# Patient Record
Sex: Female | Born: 1990
Health system: Southern US, Community
[De-identification: ages and names within clinical notes are randomized; demographics above are authoritative.]

## PROBLEM LIST (undated history)

## (undated) DIAGNOSIS — F319 Bipolar disorder, unspecified: Secondary | ICD-10-CM

## (undated) DIAGNOSIS — F329 Major depressive disorder, single episode, unspecified: Secondary | ICD-10-CM

## (undated) DIAGNOSIS — J45909 Unspecified asthma, uncomplicated: Secondary | ICD-10-CM

## (undated) DIAGNOSIS — F419 Anxiety disorder, unspecified: Secondary | ICD-10-CM

## (undated) DIAGNOSIS — F32A Depression, unspecified: Secondary | ICD-10-CM

## (undated) HISTORY — DX: Unspecified asthma, uncomplicated: J45.909

## (undated) HISTORY — DX: Bipolar disorder, unspecified: F31.9

## (undated) HISTORY — DX: Anxiety disorder, unspecified: F41.9

## (undated) HISTORY — DX: Depression, unspecified: F32.A

---
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
## 2008-01-21 DIAGNOSIS — J45909 Unspecified asthma, uncomplicated: Secondary | ICD-10-CM | POA: Insufficient documentation

## 2008-05-27 DIAGNOSIS — L708 Other acne: Secondary | ICD-10-CM | POA: Insufficient documentation

## 2010-04-16 DIAGNOSIS — J309 Allergic rhinitis, unspecified: Secondary | ICD-10-CM | POA: Insufficient documentation

## 2010-04-16 DIAGNOSIS — F329 Major depressive disorder, single episode, unspecified: Secondary | ICD-10-CM | POA: Insufficient documentation

## 2010-04-16 DIAGNOSIS — H1045 Other chronic allergic conjunctivitis: Secondary | ICD-10-CM | POA: Insufficient documentation

## 2011-06-16 DIAGNOSIS — J039 Acute tonsillitis, unspecified: Secondary | ICD-10-CM | POA: Insufficient documentation

## 2016-09-15 ENCOUNTER — Other Ambulatory Visit: Payer: Self-pay | Admitting: Nurse Practitioner

## 2016-09-15 ENCOUNTER — Other Ambulatory Visit (HOSPITAL_COMMUNITY)
Admission: RE | Admit: 2016-09-15 | Discharge: 2016-09-15 | Disposition: A | Discharge status: Home / Self Care | Payer: BLUE CROSS/BLUE SHIELD | Source: Ambulatory Visit | Attending: Nurse Practitioner | Admitting: Nurse Practitioner

## 2016-09-15 DIAGNOSIS — Z113 Encounter for screening for infections with a predominantly sexual mode of transmission: Secondary | ICD-10-CM | POA: Insufficient documentation

## 2016-09-15 DIAGNOSIS — Z124 Encounter for screening for malignant neoplasm of cervix: Secondary | ICD-10-CM | POA: Insufficient documentation

## 2016-09-22 LAB — CYTOLOGY - PAP
Chlamydia: NEGATIVE
DIAGNOSIS: UNDETERMINED — AB
HPV (WINDOPATH): NOT DETECTED
Neisseria Gonorrhea: NEGATIVE

## 2018-07-12 ENCOUNTER — Encounter: Payer: Self-pay | Admitting: Family Medicine

## 2018-07-12 ENCOUNTER — Ambulatory Visit: Payer: 59 | Admitting: Family Medicine

## 2018-07-12 VITALS — BP 124/78 | HR 99 | Temp 98.6°F | Ht 63.0 in | Wt 215.6 lb

## 2018-07-12 DIAGNOSIS — M542 Cervicalgia: Secondary | ICD-10-CM | POA: Diagnosis not present

## 2018-07-12 DIAGNOSIS — M62838 Other muscle spasm: Secondary | ICD-10-CM

## 2018-07-12 MED ORDER — NABUMETONE 500 MG PO TABS: 500.0000 mg | ORAL_TABLET | Freq: Two times a day (BID) | ORAL | 0 refills | Status: DC

## 2018-07-12 MED ORDER — CYCLOBENZAPRINE HCL 5 MG PO TABS: 5.0000 mg | ORAL_TABLET | Freq: Every evening | ORAL | 0 refills | Status: DC | PRN

## 2018-07-12 NOTE — Patient Instructions (Signed)
Use heating pad 2x/day for 10-15 min  Stretching exercises after heating pad at least once per day Take relafen 500mg  1 tab twice per day with food x 7 days Take flexeril 5mg  1 tab at bedtime as needed - this will make you sleepy   Neck Exercises Neck exercises can be important for many reasons:  They can help you to improve and maintain flexibility in your neck. This can be especially important as you age.  They can help to make your neck stronger. This can make movement easier.  They can reduce or prevent neck pain.  They may help your upper back. Ask your health care provider which neck exercises would be best for you. Exercises to improve neck flexibility Neck stretch Repeat this exercise 3-5 times. 1. Do this exercise while standing or while sitting in a chair. 2. Place your feet flat on the floor, shoulder-width apart. 3. Slowly turn your head to the right. Turn it all the way to the right so you can look over your right shoulder. Do not tilt or tip your head. 4. Hold this position for 10-30 seconds. 5. Slowly turn your head to the left, to look over your left shoulder. 6. Hold this position for 10-30 seconds.  Neck retraction Repeat this exercise 8-10 times. Do this 3-4 times a day or as told by your health care provider. 1. Do this exercise while standing or while sitting in a sturdy chair. 2. Look straight ahead. Do not bend your neck. 3. Use your fingers to push your chin backward. Do not bend your neck for this movement. Continue to face straight ahead. If you are doing the exercise properly, you will feel a slight sensation in your throat and a stretch at the back of your neck. 4. Hold the stretch for 1-2 seconds. Relax and repeat. Exercises to improve neck strength Neck press Repeat this exercise 10 times. Do it first thing in the morning and right before bed or as told by your health care provider. 1. Lie on your back on a firm bed or on the floor with a pillow under  your head. 2. Use your neck muscles to push your head down on the pillow and straighten your spine. 3. Hold the position as well as you can. Keep your head facing up and your chin tucked. 4. Slowly count to 5 while holding this position. 5. Relax for a few seconds. Then repeat. Isometric strengthening Do a full set of these exercises 2 times a day or as told by your health care provider. 1. Sit in a supportive chair and place your hand on your forehead. 2. Push forward with your head and neck while pushing back with your hand. Hold for 10 seconds. 3. Relax. Then repeat the exercise 3 times. 4. Next, do thesequence again, this time putting your hand against the back of your head. Use your head and neck to push backward against the hand pressure. 5. Finally, do the same exercise on either side of your head, pushing sideways against the pressure of your hand. Prone head lifts Repeat this exercise 5 times. Do this 2 times a day or as told by your health care provider. 1. Lie face-down, resting on your elbows so that your chest and upper back are raised. 2. Start with your head facing downward, near your chest. Position your chin either on or near your chest. 3. Slowly lift your head upward. Lift until you are looking straight ahead. Then continue lifting your  head as far back as you can stretch. 4. Hold your head up for 5 seconds. Then slowly lower it to your starting position. Supine head lifts Repeat this exercise 8-10 times. Do this 2 times a day or as told by your health care provider. 1. Lie on your back, bending your knees to point to the ceiling and keeping your feet flat on the floor. 2. Lift your head slowly off the floor, raising your chin toward your chest. 3. Hold for 5 seconds. 4. Relax and repeat. Scapular retraction Repeat this exercise 5 times. Do this 2 times a day or as told by your health care provider. 1. Stand with your arms at your sides. Look straight ahead. 2. Slowly  pull both shoulders backward and downward until you feel a stretch between your shoulder blades in your upper back. 3. Hold for 10-30 seconds. 4. Relax and repeat. Contact a health care provider if:  Your neck pain or discomfort gets much worse when you do an exercise.  Your neck pain or discomfort does not improve within 2 hours after you exercise. If you have any of these problems, stop exercising right away. Do not do the exercises again unless your health care provider says that you can. Get help right away if:  You develop sudden, severe neck pain. If this happens, stop exercising right away. Do not do the exercises again unless your health care provider says that you can. This information is not intended to replace advice given to you by your health care provider. Make sure you discuss any questions you have with your health care provider. Document Released: 12/15/2014 Document Revised: 05/09/2017 Document Reviewed: 07/14/2014 Elsevier Interactive Patient Education  2019 [employer]

## 2018-07-12 NOTE — Progress Notes (Signed)
Tiffany Ellison is a 28 y.o. female  Chief Complaint  Patient presents with  . Establish Care    est care/ pinched nerve in neck/slept on it wrong/ 7 days/ went to chiropractor/ ibuprofen    HPI: Tiffany LackMary Ellison is a 28 y.o. female here to establish care with our office. She complains of neck pain x 1 week. She believes she slept wrong/funny as she woke up 7 days ago with this pain.  Pain is left-sided. She describes as "really painfully achy" and "sharp with certain movements". Pain can improve if she lays down or if she lifts her left arm up above her head. No numbness or tingling in LUE. No weakness or change in grip strength.  She has taken ibuprofen 400-600mg  every 5-6 hrs and has seen chiropractor (6 days ago) without significant relief. She states she has had similar symptoms in the past but usually resolves in a few days.   Pt denies HA, dizziness, vision changes, CP, SOB, n/v/d/c, LE edema.  Pt has a h/o anxiety, depression, bipolar 1.  Past Medical History:  Diagnosis Date  . Anxiety   . Asthma   . Bipolar 1 disorder (HCC)   . Depression     History reviewed. No pertinent surgical history.  Social History   Socioeconomic History  . Marital status: Unknown    Spouse name: Not on file  . Number of children: Not on file  . Years of education: Not on file  . Highest education level: Not on file  Occupational History  . Not on file  Social Needs  . Financial resource strain: Not on file  . Food insecurity    Worry: Not on file    Inability: Not on file  . Transportation needs    Medical: Not on file    Non-medical: Not on file  Tobacco Use  . Smoking status: Never Smoker  . Smokeless tobacco: Never Used  Substance and Sexual Activity  . Alcohol use: Yes  . Drug use: Not on file  . Sexual activity: Not on file  Lifestyle  . Physical activity    Days per week: Not on file    Minutes per session: Not on file  . Stress: Not on file  Relationships  . Social  Musicianconnections    Talks on phone: Not on file    Gets together: Not on file    Attends religious service: Not on file    Active member of club or organization: Not on file    Attends meetings of clubs or organizations: Not on file    Relationship status: Not on file  . Intimate partner violence    Fear of current or ex partner: Not on file    Emotionally abused: Not on file    Physically abused: Not on file    Forced sexual activity: Not on file  Other Topics Concern  . Not on file  Social History Narrative  . Not on file    Family History  Problem Relation Age of Onset  . Diabetes Father   . Leukemia Paternal Grandfather       There is no immunization history on file for this patient.  Outpatient Encounter Medications as of 07/12/2018  Medication Sig  . etonogestrel (NEXPLANON) 68 MG IMPL implant Inject into the skin.  . cyclobenzaprine (FLEXERIL) 5 MG tablet Take 1 tablet (5 mg total) by mouth at bedtime as needed for muscle spasms.  . nabumetone (RELAFEN) 500 MG tablet Take 1  tablet (500 mg total) by mouth 2 (two) times daily.   No facility-administered encounter medications on file as of 07/12/2018.      ROS: Pertinent positives and negatives noted in HPI. Remainder of ROS non-contributory     Allergies  Allergen Reactions  . Amoxicillin-Pot Clavulanate Diarrhea  . Azithromycin Other (See Comments)    BP 124/78   Pulse 99   Temp 98.6 F (37 C) (Oral)   Ht 5\' 3"  (1.6 m)   Wt 215 lb 9.6 oz (97.8 kg)   SpO2 97%   BMI 38.19 kg/m   Physical Exam  Constitutional: She is oriented to person, place, and time. She appears well-developed and well-nourished. No distress.  Neck: Muscular tenderness present. No spinous process tenderness present. Decreased range of motion present. No edema and no erythema present.    Neurological: She is alert and oriented to person, place, and time. She has normal reflexes. No cranial nerve deficit. Coordination normal.   Psychiatric: She has a normal mood and affect.     A/P:  1. Musculoskeletal neck pain 2. Muscle spasms of neck - heating pad BID - exercises daily - included in AVS Rx: - nabumetone (RELAFEN) 500 MG tablet; Take 1 tablet (500 mg total) by mouth 2 (two) times daily.  Dispense: 60 tablet; Refill: 0 - cyclobenzaprine (FLEXERIL) 5 MG tablet; Take 1 tablet (5 mg total) by mouth at bedtime as needed for muscle spasms.  Dispense: 30 tablet; Refill: 0 - f/u if symptoms worsen or in 2-3 wks if no/minimal improvement Discussed plan and reviewed medications with patient, including risks, benefits, and potential side effects. Pt expressed understand. All questions answered.

## 2018-07-13 ENCOUNTER — Telehealth: Payer: Self-pay

## 2018-07-13 NOTE — Telephone Encounter (Signed)
Dr.C please advise

## 2018-07-13 NOTE — Telephone Encounter (Signed)
Pt verbalized understanding.

## 2018-07-13 NOTE — Telephone Encounter (Signed)
DP-Pt called stating that the pain medication that was given yesterday is not helping at all/she would like a call back with what to do/thx dmf

## 2018-07-13 NOTE — Telephone Encounter (Signed)
Pt was seen yesterday for muscle spasm and given Rx for anti-inflammatory (relafen 500mg  BID) and muscle relaxant (flexeril 5mg  qHS). As we discussed yesterday, it will take time for these to work along with heating pad 2x/day and stretching exercises. Pt can increase flexeril to 10mg  (2 5mg  tabs) at bedtime and can take tylenol 500mg  2 tabs every 4-6 hrs PRN along with the relafen BID with food. She needs to give meds a chance to work. If no improvement but early to mid next week, she should let me know

## 2018-07-13 NOTE — Telephone Encounter (Signed)
Left pt VM to call back to inform notes below.

## 2018-07-18 ENCOUNTER — Telehealth: Payer: Self-pay

## 2018-07-18 NOTE — Telephone Encounter (Signed)
Copied from [place] [id number]. Topic: General - Other >> Jul 18, 2018  8:32 AM Keene Breath wrote: Reason for CRM: Patient called to ask the nurse to call her regarding the pain that she is still having. Patient is out of town and would like to know what she can take.  CB# [phone]

## 2018-07-19 ENCOUNTER — Encounter: Payer: Self-pay | Admitting: Nurse Practitioner

## 2018-07-19 ENCOUNTER — Ambulatory Visit (INDEPENDENT_AMBULATORY_CARE_PROVIDER_SITE_OTHER): Payer: 59 | Admitting: Nurse Practitioner

## 2018-07-19 VITALS — HR 90 | Ht 63.0 in

## 2018-07-19 DIAGNOSIS — M5412 Radiculopathy, cervical region: Secondary | ICD-10-CM

## 2018-07-19 DIAGNOSIS — M542 Cervicalgia: Secondary | ICD-10-CM

## 2018-07-19 MED ORDER — TRAMADOL HCL 50 MG PO TABS: 50.0000 mg | ORAL_TABLET | Freq: Two times a day (BID) | ORAL | 0 refills | Status: AC | PRN

## 2018-07-19 MED ORDER — PREDNISONE 10 MG (21) PO TBPK: ORAL_TABLET | ORAL | 0 refills | Status: DC

## 2018-07-19 NOTE — Telephone Encounter (Signed)
LVm for the pt to call back

## 2018-07-19 NOTE — Telephone Encounter (Signed)
Virtual appointment was scheduled for 07/19/2018 at 10:45 AM w/ Wilfred Lacy, NP.

## 2018-07-19 NOTE — Patient Instructions (Addendum)
Continue to alternated between warm and cold compress 2-3times a day. Hold relafen and tylenol. Use oral prednisone, and flexeril daily. Use tramadol for severe pain. You will be contacted to schedule appt with sport medicine next week  Cervical Radiculopathy  Cervical radiculopathy means that a nerve in the neck (a cervical nerve) is pinched or bruised. This can happen because of an injury to the cervical spine (vertebrae) in the neck, or as a normal part of getting older. This can cause pain or loss of feeling (numbness) that runs from your neck all the way down to your arm and fingers. Often, this condition gets better with rest. Treatment may be needed if the condition does not get better. What are the causes?  A neck injury.  A bulging disk in your spine.  Muscle movements that you cannot control (muscle spasms).  Tight muscles in your neck due to overuse.  Arthritis.  Breakdown in the bones and joints of the spine (spondylosis) due to getting older.  Bone spurs that form near the nerves in the neck. What are the signs or symptoms?  Pain. The pain may: ? Run from the neck to the arm and hand. ? Be very bad or irritating. ? Be worse when you move your neck.  Loss of feeling or tingling in your arm or hand.  Weakness in your arm or hand, in very bad cases. How is this treated? In many cases, treatment is not needed for this condition. With rest, the condition often gets better over time. If treatment is needed, options may include:  Wearing a soft neck collar (cervical collar) for short periods of time, as told by your doctor.  Doing exercises (physical therapy) to strengthen your neck muscles.  Taking medicines.  Having shots (injections) in your spine, in very bad cases.  Having surgery. This may be needed if other treatments do not help. The type of surgery that is used depends on the cause of your condition. Follow these instructions at home: If you have a soft  neck collar:  Wear it as told by your doctor. Remove it only as told by your doctor.  Ask your doctor if you can remove the collar for cleaning and bathing. If you are allowed to remove the collar for cleaning or bathing: ? Follow instructions from your doctor about how to remove the collar safely. ? Clean the collar by wiping it with mild soap and water and drying it completely. ? Take out any removable pads in the collar every 1-2 days. Wash them by hand with soap and water. Let them air-dry completely before you put them back in the collar. ? Check your skin under the collar for redness or sores. If you see any, tell your doctor. Managing pain      Take over-the-counter and prescription medicines only as told by your doctor.  If told, put ice on the painful area. ? If you have a soft neck collar, remove it as told by your doctor. ? Put ice in a plastic bag. ? Place a towel between your skin and the bag. ? Leave the ice on for 20 minutes, 2-3 times a day.  If using ice does not help, you can try using heat. Use the heat source that your doctor recommends, such as a moist heat pack or a heating pad. ? Place a towel between your skin and the heat source. ? Leave the heat on for 20-30 minutes. ? Remove the heat if your skin  turns bright red. This is very important if you are unable to feel pain, heat, or cold. You may have a greater risk of getting burned.  You may try a gentle neck and shoulder rub (massage). Activity  Rest as needed.  Return to your normal activities as told by your doctor. Ask your doctor what activities are safe for you.  Do exercises as told by your doctor or physical therapist.  Do not lift anything that is heavier than 10 lb (4.5 kg) until your doctor tells you that it is safe. General instructions  Use a flat pillow when you sleep.  Do not drive while wearing a soft neck collar. If you do not have a soft neck collar, ask your doctor if it is safe to  drive while your neck heals.  Ask your doctor if the medicine prescribed to you requires you to avoid driving or using heavy machinery.  Do not use any products that contain nicotine or tobacco, such as cigarettes, e-cigarettes, and chewing tobacco. These can delay healing. If you need help quitting, ask your doctor.  Keep all follow-up visits as told by your doctor. This is important. Contact a doctor if:  Your condition does not get better with treatment. Get help right away if:  Your pain gets worse and is not helped with medicine.  You lose feeling or feel weak in your hand, arm, face, or leg.  You have a high fever.  You have a stiff neck.  You cannot control when you poop or pee (have incontinence).  You have trouble with walking, balance, or talking. Summary  Cervical radiculopathy means that a nerve in the neck is pinched or bruised.  A nerve can get pinched from a bulging disk, arthritis, an injury to the neck, or other causes.  Symptoms include pain, tingling, or loss of feeling that goes from the neck into the arm or hand.  Weakness in your arm or hand can happen in very bad cases.  Treatment may include resting, wearing a soft neck collar, and doing exercises. You might need to take medicines for pain. In very bad cases, shots or surgery may be needed. This information is not intended to replace advice given to you by your health care provider. Make sure you discuss any questions you have with your health care provider. Document Released: 12/23/2010 Document Revised: 11/24/2017 Document Reviewed: 11/24/2017 Elsevier Patient Education  2020 [employer].

## 2018-07-19 NOTE — Progress Notes (Signed)
Virtual Visit via Video Note  I connected with Tiffany Ellison on 07/19/18 at 10:45 AM EDT by a video enabled telemedicine application and verified that I am speaking with the correct person using two identifiers.  Location: Patient: Home Provider: office   I discussed the limitations of evaluation and management by telemedicine and the availability of in person appointments. The patient expressed understanding and agreed to proceed.  CC: persistent left side neck pain radiating to left arm.  History of Present Illness: Shoulder Pain  The pain is present in the neck, left shoulder and left arm. This is a new problem. The current episode started 1 to 4 weeks ago. There has been no history of extremity trauma. The problem occurs constantly. The problem has been unchanged. The quality of the pain is described as aching. The pain is severe. Associated symptoms include tingling. Pertinent negatives include no fever, inability to bear weight, joint swelling, limited range of motion, numbness or stiffness. The symptoms are aggravated by activity and lying down. She has tried NSAIDS, heat and OTC pain meds (and muscle relaxant) for the symptoms. The treatment provided mild relief.   Observations/Objective: Physical Exam  Constitutional: She is oriented to person, place, and time.  Eyes: Pupils are equal, round, and reactive to light. EOM are normal.  Neck: Muscular tenderness present.  Guard head rotation to left side. Normal ROM up and down, and right  Pulmonary/Chest: Effort normal.  Musculoskeletal:        General: No edema.     Left shoulder: Normal.       Arms:  Neurological: She is alert and oriented to person, place, and time.   Assessment and Plan: Tiffany Ellison was seen today for shoulder pain.  Diagnoses and all orders for this visit:  Cervical radiculopathy -     predniSONE (STERAPRED UNI-PAK 21 TAB) 10 MG (21) TBPK tablet; As directed on package -     Ambulatory referral to Sports  Medicine -     traMADol (ULTRAM) 50 MG tablet; Take 1 tablet (50 mg total) by mouth every 12 (twelve) hours as needed for up to 3 days.  Musculoskeletal neck pain -     predniSONE (STERAPRED UNI-PAK 21 TAB) 10 MG (21) TBPK tablet; As directed on package -     Ambulatory referral to Sports Medicine -     traMADol (ULTRAM) 50 MG tablet; Take 1 tablet (50 mg total) by mouth every 12 (twelve) hours as needed for up to 3 days.   Follow Up Instructions: Continue to alternated between warm and cold compress 2-3times a day. Hold relafen and tylenol. Use oral prednisone, and flexeril daily. Use tramadol for severe pain. You will be contacted to schedule appt with sport medicine next week   I discussed the assessment and treatment plan with the patient. The patient was provided an opportunity to ask questions and all were answered. The patient agreed with the plan and demonstrated an understanding of the instructions.   The patient was advised to call back or seek an in-person evaluation if the symptoms worsen or if the condition fails to improve as anticipated.  Wilfred Lacy, NP

## 2018-07-23 ENCOUNTER — Ambulatory Visit (INDEPENDENT_AMBULATORY_CARE_PROVIDER_SITE_OTHER): Payer: 59 | Admitting: Nurse Practitioner

## 2018-07-23 ENCOUNTER — Other Ambulatory Visit (INDEPENDENT_AMBULATORY_CARE_PROVIDER_SITE_OTHER): Payer: 59

## 2018-07-23 ENCOUNTER — Other Ambulatory Visit: Payer: Self-pay

## 2018-07-23 ENCOUNTER — Ambulatory Visit (INDEPENDENT_AMBULATORY_CARE_PROVIDER_SITE_OTHER)
Admission: RE | Admit: 2018-07-23 | Discharge: 2018-07-23 | Disposition: A | Discharge status: Home / Self Care | Payer: 59 | Source: Ambulatory Visit | Attending: Family Medicine | Admitting: Family Medicine

## 2018-07-23 ENCOUNTER — Encounter: Payer: Self-pay | Admitting: Nurse Practitioner

## 2018-07-23 ENCOUNTER — Encounter: Payer: Self-pay | Admitting: Family Medicine

## 2018-07-23 ENCOUNTER — Ambulatory Visit: Payer: 59 | Admitting: Family Medicine

## 2018-07-23 VITALS — BP 124/90 | HR 88 | Ht 63.0 in | Wt 216.0 lb

## 2018-07-23 VITALS — Ht 63.0 in

## 2018-07-23 DIAGNOSIS — M542 Cervicalgia: Secondary | ICD-10-CM

## 2018-07-23 DIAGNOSIS — M5412 Radiculopathy, cervical region: Secondary | ICD-10-CM

## 2018-07-23 DIAGNOSIS — M2550 Pain in unspecified joint: Secondary | ICD-10-CM | POA: Diagnosis not present

## 2018-07-23 LAB — COMPREHENSIVE METABOLIC PANEL
ALT: 20 U/L (ref 0–35)
AST: 13 U/L (ref 0–37)
Albumin: 4.3 g/dL (ref 3.5–5.2)
Alkaline Phosphatase: 46 U/L (ref 39–117)
BUN: 16 mg/dL (ref 6–23)
CO2: 25 mEq/L (ref 19–32)
Calcium: 8.7 mg/dL (ref 8.4–10.5)
Chloride: 102 mEq/L (ref 96–112)
Creatinine, Ser: 0.84 mg/dL (ref 0.40–1.20)
GFR: 79.67 mL/min (ref >60.00)
Glucose, Bld: 98 mg/dL (ref 70–99)
Potassium: 3.6 mEq/L (ref 3.5–5.1)
Sodium: 136 mEq/L (ref 135–145)
Total Bilirubin: 0.4 mg/dL (ref 0.2–1.2)
Total Protein: 7.8 g/dL (ref 6.0–8.3)

## 2018-07-23 LAB — CBC WITH DIFFERENTIAL/PLATELET
Basophils Absolute: 0.1 10*3/uL (ref 0.0–0.1)
Basophils Relative: 0.3 % (ref 0.0–3.0)
Eosinophils Absolute: 0 10*3/uL (ref 0.0–0.7)
Eosinophils Relative: 0.1 % (ref 0.0–5.0)
HCT: 42.7 % (ref 36.0–46.0)
Hemoglobin: 14.3 g/dL (ref 12.0–15.0)
Lymphocytes Relative: 13.5 % (ref 12.0–46.0)
Lymphs Abs: 2 10*3/uL (ref 0.7–4.0)
MCHC: 33.4 g/dL (ref 30.0–36.0)
MCV: 92.4 fl (ref 78.0–100.0)
Monocytes Absolute: 1 10*3/uL (ref 0.1–1.0)
Monocytes Relative: 6.6 % (ref 3.0–12.0)
Neutro Abs: 11.6 10*3/uL — ABNORMAL HIGH (ref 1.4–7.7)
Neutrophils Relative %: 79.5 % — ABNORMAL HIGH (ref 43.0–77.0)
Platelets: 278 10*3/uL (ref 150.0–400.0)
RBC: 4.63 Mil/uL (ref 3.87–5.11)
RDW: 13.7 % (ref 11.5–15.5)
WBC: 14.6 10*3/uL — ABNORMAL HIGH (ref 4.0–10.5)

## 2018-07-23 LAB — IBC PANEL
Iron: 90 ug/dL (ref 42–145)
Saturation Ratios: 28.1 % (ref 20.0–50.0)
Transferrin: 229 mg/dL (ref 212.0–360.0)

## 2018-07-23 LAB — TSH: TSH: 0.68 u[IU]/mL (ref 0.35–4.50)

## 2018-07-23 LAB — URIC ACID: Uric Acid, Serum: 3.8 mg/dL (ref 2.4–7.0)

## 2018-07-23 LAB — FERRITIN: Ferritin: 54.7 ng/mL (ref 10.0–291.0)

## 2018-07-23 LAB — SEDIMENTATION RATE: Sed Rate: 13 mm/hr (ref 0–20)

## 2018-07-23 MED ORDER — METHYLPREDNISOLONE ACETATE 80 MG/ML IJ SUSP
80.0000 mg | Freq: Once | INTRAMUSCULAR | Status: AC
  Administered 2018-07-23: 15:00:00 80 mg via INTRAMUSCULAR

## 2018-07-23 MED ORDER — KETOROLAC TROMETHAMINE 60 MG/2ML IM SOLN
60.0000 mg | Freq: Once | INTRAMUSCULAR | Status: AC
  Administered 2018-07-23: 15:00:00 60 mg via INTRAMUSCULAR

## 2018-07-23 MED ORDER — GABAPENTIN 100 MG PO CAPS: 200.0000 mg | ORAL_CAPSULE | Freq: Every day | ORAL | 3 refills | Status: DC

## 2018-07-23 NOTE — Patient Instructions (Addendum)
Good to see you Xray downstairs  Good to see you.  Gabapentin 200 mg at night   See me again in 7-10 days

## 2018-07-23 NOTE — Assessment & Plan Note (Signed)
Patient does have some cervical radiculopathy symptoms.  More of a tightness.  Patient has full strength and pain.  Patient did not do well with different medication but I do think patient will do well with the gabapentin.  Discussed icing regimen, Tylenol 650 mg 3 times a day.  Discussed icing regimen compared to the floor.  Patient given home exercises.  X-rays and laboratory work-up pending secondary to the pain but not so severe.  Patient will follow-up with me again in 7 to 10 days and if any radicular symptoms seem to be worsening imaging would be warranted.

## 2018-07-23 NOTE — Progress Notes (Signed)
[facility] [address] [place], [place] [address] Phone: [phone] Subjective:   I [name] am serving as a Education administrator for Dr. Hulan Saas.  I'm seeing this patient by the request  of:  Cirigliano, Garvin Fila, DO   CC:   Neck and shoulder pain  QPY:[id number]  Tiffany Ellison is a 28 y.o. female coming in with complaint of neck and shoulder pain. States she's had a pinched nerve for about 3 weeks and nothing has worked. Radiculopathy. Most of her pain is in her shoulder pain and radiates down the arm. Little tingling down left arm.  Onset- 3 weeks ago  Location Duration-  Character-aching sensation with sharp pains Aggravating factors- Side rotation  Reliving factors- medication, ice, hot compress  Therapies tried-many different medications with no significant improvement she states.  She is includes Relafen, prednisone, as well as Flexeril Severity-9 out of 10     Past Medical History:  Diagnosis Date  . Anxiety   . Asthma   . Bipolar 1 disorder ([facility])   . Depression    No past surgical history on file. Social History   Socioeconomic History  . Marital status: Unknown    Spouse name: Not on file  . Number of children: Not on file  . Years of education: Not on file  . Highest education level: Not on file  Occupational History  . Not on file  Social Needs  . Financial resource strain: Not on file  . Food insecurity    Worry: Not on file    Inability: Not on file  . Transportation needs    Medical: Not on file    Non-medical: Not on file  Tobacco Use  . Smoking status: Never Smoker  . Smokeless tobacco: Never Used  Substance and Sexual Activity  . Alcohol use: Yes  . Drug use: Not on file  . Sexual activity: Not on file  Lifestyle  . Physical activity    Days per week: Not on file    Minutes per session: Not on file  . Stress: Not on file  Relationships  . Social Herbalist on phone: Not on file    Gets together:  Not on file    Attends religious service: Not on file    Active member of club or organization: Not on file    Attends meetings of clubs or organizations: Not on file    Relationship status: Not on file  Other Topics Concern  . Not on file  Social History Narrative  . Not on file   Allergies  Allergen Reactions  . Amoxicillin-Pot Clavulanate Diarrhea  . Azithromycin Other (See Comments)   Family History  Problem Relation Age of Onset  . Diabetes Father   . Leukemia Paternal Grandfather     Current Outpatient Medications (Endocrine & Metabolic):  .  etonogestrel (NEXPLANON) 68 MG IMPL implant, Inject into the skin. .  predniSONE (STERAPRED UNI-PAK 21 TAB) 10 MG (21) TBPK tablet, As directed on package    Current Outpatient Medications (Analgesics):  Marland Kitchen  Acetaminophen (TYLENOL 8 HOUR PO), Take 1,300 mg by mouth. BID .  nabumetone (RELAFEN) 500 MG tablet, Take 1 tablet (500 mg total) by mouth 2 (two) times daily.   Current Outpatient Medications (Other):  .  cyclobenzaprine (FLEXERIL) 5 MG tablet, Take 1 tablet (5 mg total) by mouth at bedtime as needed for muscle spasms. Marland Kitchen  gabapentin (NEURONTIN) 100 MG capsule, Take 2 capsules (  200 mg total) by mouth at bedtime.    Past medical history, social, surgical and family history all reviewed in electronic medical record.  No pertanent information unless stated regarding to the chief complaint.   Review of Systems:  No headache, visual changes, nausea, vomiting, diarrhea, constipation, dizziness, abdominal pain, skin rash, fevers, chills, night sweats, weight loss, swollen lymph nodes, body aches, joint swelling, muscle aches, chest pain, shortness of breath, mood changes.   Objective  Blood pressure 124/90, pulse 88, height 5\' 3"  (1.6 m), weight 216 lb (98 kg), last menstrual period 07/14/2018, SpO2 98 %.    General: Patient appears fairly anxious and does have tangential thought HEENT: Pupils equal, extraocular movements  intact  Respiratory: Patient's speak in full sentences and does not appear short of breath  Cardiovascular: No lower extremity edema, non tender, no erythema  Skin: Warm dry intact with no signs of infection or rash on extremities or on axial skeleton.  Abdomen: Soft nontender  Neuro: Cranial nerves II through XII are intact, neurovascularly intact in all extremities with 2+ DTRs and 2+ pulses.  Lymph: No lymphadenopathy of posterior or anterior cervical chain or axillae bilaterally.  Gait normal with good balance and coordination.  Patient can use to pace during exam MSK:  tender with full range of motion and good stability and symmetric strength and tone of shoulders, elbows, wrist, hip, knee and ankles bilaterally.  Patient's pain to even light palpation in different areas is out of proportion.  Patient's neck exam does have some tightness and lacks last 10 degrees of extension as well as left-sided sidebending.  Patient is tender to palpation but possibly more than what it should be some light palpation.  Good grip strength.  Neurovascularly intact distally.  Good capillary refill.    Impression and Recommendations:     This case required medical decision making of moderate complexity. The above documentation has been reviewed and is accurate and complete Judi SaaZachary M Ezell Poke, DO       Note: This dictation was prepared with Dragon dictation along with smaller phrase technology. Any transcriptional errors that result from this process are unintentional.

## 2018-07-23 NOTE — Progress Notes (Signed)
Virtual Visit via Video Note  I connected with Tiffany Ellison on 07/23/18 at  9:30 AM EDT by a video enabled telemedicine application and verified that I am speaking with the correct person using two identifiers.  Location: Patient: [name] Provider: Office   I discussed the limitations of evaluation and management by telemedicine and the availability of in person appointments. The patient expressed understanding and agreed to proceed.  CC: neck and shoulder pain.  History of Present Illness: Ms. Tiffany Ellison reports no improvement in symptoms since last Thursday, despite use of NSAIDs, tylenol, oral prednisone, tramadol and muscle relaxant. Reports insomnia and increase anxiety with tramadol. Denies any new symptoms. appt with sports medicine scheduled for tomorrow   Observations/Objective: Physical Exam  Pulmonary/Chest: Effort normal.  Psychiatric: Her mood appears anxious.  tearful   Assessment and Plan: Tiffany Ellison was seen today for follow-up.  Diagnoses and all orders for this visit:  Cervical radiculopathy  Musculoskeletal neck pain   Follow Up Instructions: appt with sports medicine rescheduled to today afternoon.    I discussed the assessment and treatment plan with the patient. The patient was provided an opportunity to ask questions and all were answered. The patient agreed with the plan and demonstrated an understanding of the instructions.   The patient was advised to call back or seek an in-person evaluation if the symptoms worsen or if the condition fails to improve as anticipated.   Wilfred Lacy, NP

## 2018-07-24 ENCOUNTER — Emergency Department (HOSPITAL_COMMUNITY)
Admission: EM | Admit: 2018-07-24 | Discharge: 2018-07-24 | Disposition: A | Discharge status: Home / Self Care | Payer: 59 | Attending: Emergency Medicine | Admitting: Emergency Medicine

## 2018-07-24 ENCOUNTER — Ambulatory Visit: Payer: 59 | Admitting: Family Medicine

## 2018-07-24 ENCOUNTER — Other Ambulatory Visit: Payer: Self-pay

## 2018-07-24 ENCOUNTER — Encounter (HOSPITAL_COMMUNITY): Payer: Self-pay

## 2018-07-24 ENCOUNTER — Telehealth: Payer: Self-pay | Admitting: *Deleted

## 2018-07-24 ENCOUNTER — Encounter: Payer: Self-pay | Admitting: *Deleted

## 2018-07-24 DIAGNOSIS — M542 Cervicalgia: Secondary | ICD-10-CM | POA: Diagnosis present

## 2018-07-24 DIAGNOSIS — J45909 Unspecified asthma, uncomplicated: Secondary | ICD-10-CM | POA: Diagnosis not present

## 2018-07-24 DIAGNOSIS — M5410 Radiculopathy, site unspecified: Secondary | ICD-10-CM | POA: Diagnosis not present

## 2018-07-24 MED ORDER — LIDOCAINE 5 % EX PTCH: 1.0000 | MEDICATED_PATCH | CUTANEOUS | 0 refills | Status: DC

## 2018-07-24 MED ORDER — HYDROCODONE-ACETAMINOPHEN 5-325 MG PO TABS: 1.0000 | ORAL_TABLET | Freq: Three times a day (TID) | ORAL | 0 refills | Status: DC | PRN

## 2018-07-24 MED ORDER — KETOROLAC TROMETHAMINE 15 MG/ML IJ SOLN: 15.0000 mg | Freq: Once | INTRAMUSCULAR | Status: DC

## 2018-07-24 MED ORDER — KETOROLAC TROMETHAMINE 30 MG/ML IJ SOLN
30.0000 mg | Freq: Once | INTRAMUSCULAR | Status: AC
  Administered 2018-07-24: 30 mg via INTRAMUSCULAR
  Filled 2018-07-24: qty 1

## 2018-07-24 MED ORDER — LIDOCAINE 5 % EX PTCH
1.0000 | MEDICATED_PATCH | CUTANEOUS | Status: DC
  Administered 2018-07-24: 1 via TRANSDERMAL
  Filled 2018-07-24: qty 1

## 2018-07-24 NOTE — Discharge Instructions (Signed)
Prescription given for Norco. Take medication as directed and do not operate machinery, drive a car, or work while taking this medication as it can make you drowsy.   Please follow up with your orthopedic provider within 5-7 days for re-evaluation of your symptoms.  provided for you to make arrangements for follow up care. Please return to the emergency department for any new or worsening symptoms.

## 2018-07-24 NOTE — ED Triage Notes (Signed)
Patient states she "woke up 3 weeks ago  with a crick in her neck and pain in the left upper arm. Patient states the only pain relief she has had is when the sports medicine doctor gave a her a pain shot.  Patient then said she was prescribed "f------ Tramadol and that did not help." Patient crying to the point that it is hard to understand her when she talks.

## 2018-07-24 NOTE — ED Provider Notes (Addendum)
[facility]-EMERGENCY DEPT Provider Note   CSN: [id number] Arrival date & time: 07/24/18  1226    History   Chief Complaint Chief Complaint  Patient presents with  . Neck Pain    HPI Tiffany Ellison is a 28 y.o. female.     HPI   Pt is a 28 year old female with history of anxiety, asthma/depression, bipolar disorder, who presents the emergency department today complaining of neck pain. States that 3 weeks ago she woke a with a crick in her neck. States this has happened many times before and it normally goes away on its own however this time her sxs have not improved with multiple interventions.  Rates pain 10/10.  Reports associated radicular sxs down the LUE. Denies numbness/weakness to the LUE but is having some paraesthesias.  She has been seen by her pcp who has given her rx for flexeril, gabapentin, prednisone and tramadol.  These medications have not improved her symptoms. She has been referred to sports medicine and saw them in the office yesterday.  She had x-ray completed as well as labs.  X-ray reviewed and showed narrowing at C4-C5 and C5-C6.  Also with reversal of cervical lordosis thought to be secondary to muscle spasm.  Past Medical History:  Diagnosis Date  . Anxiety   . Asthma   . Bipolar 1 disorder ([facility])   . Depression     Patient Active Problem List   Diagnosis Date Noted  . Cervical radiculopathy 07/23/2018    History reviewed. No pertinent surgical history.   OB History   No obstetric history on file.      Home Medications    Prior to Admission medications   Medication Sig Start Date End Date Taking? Authorizing Provider  Acetaminophen (TYLENOL 8 HOUR PO) Take 1,300 mg by mouth. BID    [provider]  cyclobenzaprine (FLEXERIL) 5 MG tablet Take 1 tablet (5 mg total) by mouth at bedtime as needed for muscle spasms. 07/12/18   Cirigliano, Jearld LeschMary K, DO  etonogestrel (NEXPLANON) 68 MG IMPL implant Inject into the skin. 04/17/14    [provider]  gabapentin (NEURONTIN) 100 MG capsule Take 2 capsules (200 mg total) by mouth at bedtime. 07/23/18   Judi SaaSmith, Zachary M, DO  HYDROcodone-acetaminophen (NORCO/VICODIN) 5-325 MG tablet Take 1 tablet by mouth every 8 (eight) hours as needed. 07/24/18   Vicenta Olds S, PA-C  lidocaine (LIDODERM) 5 % Place 1 patch onto the skin daily. Remove & Discard patch within 12 hours or as directed by MD 07/24/18   Avrian Delfavero S, PA-C  nabumetone (RELAFEN) 500 MG tablet Take 1 tablet (500 mg total) by mouth 2 (two) times daily. 07/12/18   Cirigliano, Jearld LeschMary K, DO  predniSONE (STERAPRED UNI-PAK 21 TAB) 10 MG (21) TBPK tablet As directed on package 07/19/18   Nche, Bonna Gainsharlotte Lum, NP    Family History Family History  Problem Relation Age of Onset  . Diabetes Father   . Leukemia Paternal Grandfather     Social History Social History   Tobacco Use  . Smoking status: Never Smoker  . Smokeless tobacco: Never Used  Substance Use Topics  . Alcohol use: Yes  . Drug use: Never     Allergies   Amoxicillin-pot clavulanate and Azithromycin   Review of Systems Review of Systems  Constitutional: Negative for fever.  Respiratory: Negative for shortness of breath.   Cardiovascular: Negative for chest pain.  Gastrointestinal: Negative for nausea and vomiting.  Musculoskeletal: Positive for neck  pain.       Lue pain  Skin: Negative for rash.  Neurological: Negative for weakness and numbness.       Paresthesias     Physical Exam Updated Vital Signs BP (!) 130/94 (BP Location: Right Arm)   Pulse 90   Temp 97.9 F (36.6 C) (Oral)   Resp 20   Ht 5\' 3"  (1.6 m)   Wt 98 kg   LMP 07/14/2018   SpO2 99%   BMI 38.26 kg/m   Physical Exam Vitals signs and nursing note reviewed.  Constitutional:      General: She is not in acute distress.    Appearance: She is well-developed.  HENT:     Head: Normocephalic and atraumatic.  Eyes:     Conjunctiva/sclera: Conjunctivae normal.   Neck:     Musculoskeletal: Neck supple.  Cardiovascular:     Rate and Rhythm: Normal rate.  Pulmonary:     Effort: Pulmonary effort is normal.  Musculoskeletal: Normal range of motion.     Comments: No midline cervical spine ttp. TTP to the left trapezius and to the anterior portion of the shoulder.  5/5 strength to bilateral upper extremities.  Normal sensation throughout.  Negative Hawkins test.  Negative crossover test.  Full range of motion of the left upper extremity.  No warmth erythema or swelling to the left shoulder.  Skin:    General: Skin is warm and dry.  Neurological:     Mental Status: She is alert.  Psychiatric:     Comments: Patient is very anxious and is pacing around the room.  She is tearful.      ED Treatments / Results  Labs (all labs ordered are listed, but only abnormal results are displayed) Labs Reviewed - No data to display  EKG None  Radiology Dg Cervical Spine Complete  Result Date: 07/23/2018 CLINICAL DATA:  Cervicalgia with left-sided radicular symptoms EXAM: CERVICAL SPINE - COMPLETE 4+ VIEW COMPARISON:  None. FINDINGS: Frontal, lateral, open-mouth odontoid, and bilateral oblique views were obtained. There is no fracture or spondylolisthesis. Prevertebral soft tissues and predental space regions are normal. There is slight disc space narrowing at C4-5 and C5-6 anteriorly. Other disc spaces appear unremarkable. There is no appreciable facet arthropathy. There is reversal of lordotic curvature.  Lung apices are clear. IMPRESSION: Reversal of lordotic curvature, a finding most likely indicative of a degree of muscle spasm. Slight disc space narrowing anteriorly at C4-5 and C5-6. No fracture or spondylolisthesis. No appreciable facet arthropathy. Electronically Signed   By: Lowella Grip III M.D.   On: 07/23/2018 19:51    Procedures Procedures (including critical care time)  Medications Ordered in ED Medications  lidocaine (LIDODERM) 5 % 1 patch  (has no administration in time range)  ketorolac (TORADOL) 30 MG/ML injection 30 mg (30 mg Intramuscular Given 07/24/18 1421)     Initial Impression / Assessment and Plan / ED Course  I have reviewed the triage vital signs and the nursing notes.  Pertinent labs & imaging results that were available during my care of the patient were reviewed by me and considered in my medical decision making (see chart for details).      Final Clinical Impressions(s) / ED Diagnoses   Final diagnoses:  Radiculopathy, unspecified spinal region   Pt presents to the ER with neck pain that is radiating down arm.  She has no neurologic deficits or signs of septic arthritis.  She had imaging yesterday after being seen by sports medicine.  Showed some to space narrowing and concern for muscle spasm.  She has been treated with NSAIDs, prednisone, Flexeril, gabapentin, tramadol and has had no relief.  She seen in the office yesterday and had a shot of Solu-Medrol and Toradol which she states is the only thing that has helped her symptoms.  Will give a dose of Toradol in the ED.  Will give Rx for Lidoderm patches and Norco and have her follow-up with her orthopedic provider.  Have advised on specific return precautions.  She voices understanding of the plan and reasons to return.  All questions answered.  Patient stable for discharge.  [facility] reviewed and there are no red flags.  ED Discharge Orders         Ordered    lidocaine (LIDODERM) 5 %  Every 24 hours     07/24/18 1422    HYDROcodone-acetaminophen (NORCO/VICODIN) 5-325 MG tablet  Every 8 hours PRN     07/24/18 1422           Taralee Marcus S, PA-C 07/24/18 1422    Karrie MeresCouture, Keath Matera S, PA-C 07/24/18 1422    Gerhard MunchLockwood, Robert, MD 07/25/18 [id number]

## 2018-07-24 NOTE — Telephone Encounter (Signed)
Patient called crying throughout the entire phone call stating that she is still in a lot of pain and was requesting something to help with the pain. I explained to the patient that Dr. Tamala Julian advised her that if she was in that much pain then she needed to go to the ED. Pt declined & stated "she can't afford that." Patient then requested a MRI. I advised patient that I can check with Dr. Tamala Julian to see if he is okay with ordering that but it was not likely that she would be able to get this done today. I once again explained to the patient that if the meds that have recently been prescribed by Dr. Tamala Julian, Dr. Bryan Lemma, & Wilfred Lacy, NP to help with the pain were not helping then Dr. Tamala Julian felt like her best option was to go to the ED to be treated. Patient declined once again, continued to cry, use profanity, and then she hung-up.

## 2018-07-25 ENCOUNTER — Ambulatory Visit: Payer: Self-pay

## 2018-07-25 ENCOUNTER — Ambulatory Visit: Payer: Self-pay | Admitting: *Deleted

## 2018-07-25 ENCOUNTER — Telehealth: Payer: Self-pay

## 2018-07-25 NOTE — Telephone Encounter (Signed)
LVM for pt to call back.

## 2018-07-25 NOTE — Telephone Encounter (Signed)
Left pt VM to call back, need to give instructions below

## 2018-07-25 NOTE — Telephone Encounter (Signed)
Dr. Tamala Julian thought the nerve pain was due to muscle spasm and the meds she has been Rx'd are to treat and fix the problem. However per his instructions "Patient will follow-up with me again in 7 to 10 days and if any radicular symptoms seem to be worsening imaging would be warranted." Would she want to f/u with him to discuss imaging since he has seen and examined her previously? If not, I will place neuro referral.

## 2018-07-25 NOTE — Telephone Encounter (Signed)
Sorry I may be missing it, but to whom is pt requesting a referral?

## 2018-07-25 NOTE — Telephone Encounter (Signed)
The Rx she was given in ER (norco) is a combo of hydrocodone and tylenol. She can take 2 of these together but should not take additional tylenol with this. Pt can increase gabapentin from 200mg  to 300mg  qHS. If pain is still not controlled, she can f/u with sports med Dr. Tamala Julian or return to ER. ER may also be able to connect her with social work to help with resources for food

## 2018-07-25 NOTE — Telephone Encounter (Signed)
Dr. Loletha Grayer please advise?   Copied from [place] [phone]. Topic: Referral - Request for Referral >> Jul 25, 2018 11:05 AM Ivar Drape wrote: Has patient seen PCP for this complaint?   YES *If NO, is insurance requiring patient see PCP for this issue before PCP can refer them? Referral for which specialty:    Preferred provider/office:  NO Reason for referral:  Very severe pain in her arm from a pinched nerve in her neck

## 2018-07-25 NOTE — Telephone Encounter (Signed)
Pt stated that she is having nerve issues and nothing has been helping the back pain.  She stated that her dad has the same issue.  He sees a neuo Dr.  So she would like to be referred to a neurologist.  She wants to have a MRI of her back. She stated she would like a Dr in [place]. She stated she wants to fix the problem and not treat the pain

## 2018-07-25 NOTE — Telephone Encounter (Signed)
Incoming call  From Patient with complaint of severe pain on left arm .  Patient is very upset.Very Upset.  Crying  Hysterically.  Patient was in the Er yesterday and received two injections that helped.  Patient inquired if she could taken 2 hydrocodone with tylenol.  Patient states that she had taken 2 tylenol earlier. Advised to take with food.  Patient states   She does't have any food in her house nor any friends that could bring her food.  States she is poor.  Recommended Patient go back to ER for further evaluation. Reason for Disposition . [1] SEVERE pain AND [2] not improved 2 hours after pain medicine  Answer Assessment - Initial Assessment Questions 1. ONSET: "When did the pain start?"     3 weeks ago 2. LOCATION: "Where is the pain located?"     *No Answer* 3. PAIN: "How bad is the pain?" (Scale 1-10; or mild, moderate, severe)   - MILD (1-3): doesn't interfere with normal activities   - MODERATE (4-7): interferes with normal activities (e.g., work or school) or awakens from sleep   - SEVERE (8-10): excruciating pain, unable to do any normal activities, unable to hold a cup of water   severe 4. WORK OR EXERCISE: "Has there been any recent work or exercise that involved this part of the body?"   yes 5. CAUSE: "What do you think is causing the arm pain?"     Pinched nerve 6. OTHER SYMPTOMS: "Do you have any other symptoms?" (e.g., neck pain, swelling, rash, fever, numbness, weakness)     neck 7. PREGNANCY: "Is there any chance you are pregnant?" "When was your last menstrual period?"  Protocols used: ARM PAIN-A-AH

## 2018-07-25 NOTE — Telephone Encounter (Signed)
Tried to call pt to verify and give pt notes below, pt phone will not ring. Will try again tomorrow.

## 2018-07-25 NOTE — Telephone Encounter (Signed)
See TE.

## 2018-07-26 LAB — ANA: Anti Nuclear Antibody (ANA): NEGATIVE

## 2018-07-26 LAB — RHEUMATOID FACTOR: Rheumatoid fact SerPl-aCnc: <14 IU/mL (ref <14)

## 2018-07-26 LAB — EXTRA SPECIMEN

## 2018-07-26 LAB — PTH, INTACT AND CALCIUM
Calcium: 9.1 mg/dL (ref 8.6–10.2)
PTH: 97 pg/mL — ABNORMAL HIGH (ref 14–64)

## 2018-07-26 LAB — CALCIUM, IONIZED: Calcium, Ion: 5.1 mg/dL (ref 4.8–5.6)

## 2018-07-26 LAB — VITAMIN D 1,25 DIHYDROXY
Vitamin D 1, 25 (OH)2 Total: 84 pg/mL — ABNORMAL HIGH (ref 18–72)
Vitamin D2 1, 25 (OH)2: <8 pg/mL
Vitamin D3 1, 25 (OH)2: 84 pg/mL

## 2018-07-27 ENCOUNTER — Ambulatory Visit: Payer: Self-pay | Admitting: Family Medicine

## 2018-07-27 NOTE — Telephone Encounter (Signed)
Pt was given instructions per Dr. Loletha Grayer to take 100mg  gabapentin morning, midday, then 200 mg at night. She should follow up with Dr. Tamala Julian.

## 2018-07-27 NOTE — Telephone Encounter (Signed)
I am not comfortable prescribing additional narcotic pain med for the patient. She can contact Dr. Tamala Julian but I cannot guarantee he will refill hydrocodone. Perhaps he can get her in for f/u appt sooner than currently scheduled on 7/15. She has been Rx'd NSAID, flexeril, steroid taper, gabapentin. I do not know that I have anything further to offer the patient

## 2018-07-27 NOTE — Telephone Encounter (Signed)
Dr.C please advise

## 2018-07-27 NOTE — Telephone Encounter (Signed)
Pt. Reports she is continuing to have pain in her left arm that "is unbearable.Have not slept in 4 days." Reports she can't tolerate Tramadol and is out of Hydrocodone. Has an appointment next week with Dr. Burnard Bunting need an MRI." Requesting something " for my pain." Please advise pt. Contact # [phone].  Answer Assessment - Initial Assessment Questions 1. ONSET: "When did the pain start?"     Started 3 weeks 2. LOCATION: "Where is the pain located?"     Left arm 3. PAIN: "How bad is the pain?" (Scale 1-10; or mild, moderate, severe)   - MILD (1-3): doesn't interfere with normal activities   - MODERATE (4-7): interferes with normal activities (e.g., work or school) or awakens from sleep   - SEVERE (8-10): excruciating pain, unable to do any normal activities, unable to hold a cup of water     7-8 4. WORK OR EXERCISE: "Has there been any recent work or exercise that involved this part of the body?"     No 5. CAUSE: "What do you think is causing the arm pain?"     Unsure 6. OTHER SYMPTOMS: "Do you have any other symptoms?" (e.g., neck pain, swelling, rash, fever, numbness, weakness)     No 7. PREGNANCY: "Is there any chance you are pregnant?" "When was your last menstrual period?"     No  Protocols used: ARM PAIN-A-AH

## 2018-07-28 ENCOUNTER — Encounter (HOSPITAL_COMMUNITY): Payer: Self-pay

## 2018-07-28 ENCOUNTER — Other Ambulatory Visit: Payer: Self-pay

## 2018-07-28 ENCOUNTER — Emergency Department (HOSPITAL_COMMUNITY): Payer: 59

## 2018-07-28 ENCOUNTER — Emergency Department (HOSPITAL_COMMUNITY)
Admission: EM | Admit: 2018-07-28 | Discharge: 2018-07-28 | Disposition: A | Discharge status: Home / Self Care | Payer: 59 | Attending: Emergency Medicine | Admitting: Emergency Medicine

## 2018-07-28 DIAGNOSIS — M79602 Pain in left arm: Secondary | ICD-10-CM | POA: Diagnosis present

## 2018-07-28 DIAGNOSIS — R202 Paresthesia of skin: Secondary | ICD-10-CM | POA: Diagnosis not present

## 2018-07-28 DIAGNOSIS — M62838 Other muscle spasm: Secondary | ICD-10-CM | POA: Diagnosis not present

## 2018-07-28 DIAGNOSIS — Z79899 Other long term (current) drug therapy: Secondary | ICD-10-CM | POA: Diagnosis not present

## 2018-07-28 LAB — CBC WITH DIFFERENTIAL/PLATELET
Abs Immature Granulocytes: 0.03 10*3/uL (ref 0.00–0.07)
Basophils Absolute: 0.1 10*3/uL (ref 0.0–0.1)
Basophils Relative: 1 %
Eosinophils Absolute: 0.3 10*3/uL (ref 0.0–0.5)
Eosinophils Relative: 2 %
HCT: 44.4 % (ref 36.0–46.0)
Hemoglobin: 14.7 g/dL (ref 12.0–15.0)
Immature Granulocytes: 0 %
Lymphocytes Relative: 25 %
Lymphs Abs: 2.6 10*3/uL (ref 0.7–4.0)
MCH: 31.1 pg (ref 26.0–34.0)
MCHC: 33.1 g/dL (ref 30.0–36.0)
MCV: 94.1 fL (ref 80.0–100.0)
Monocytes Absolute: 1 10*3/uL (ref 0.1–1.0)
Monocytes Relative: 9 %
Neutro Abs: 6.6 10*3/uL (ref 1.7–7.7)
Neutrophils Relative %: 63 %
Platelets: 283 10*3/uL (ref 150–400)
RBC: 4.72 MIL/uL (ref 3.87–5.11)
RDW: 12.9 % (ref 11.5–15.5)
WBC: 10.5 10*3/uL (ref 4.0–10.5)
nRBC: 0 % (ref 0.0–0.2)

## 2018-07-28 LAB — COMPREHENSIVE METABOLIC PANEL
ALT: 33 U/L (ref 0–44)
AST: 20 U/L (ref 15–41)
Albumin: 4.2 g/dL (ref 3.5–5.0)
Alkaline Phosphatase: 48 U/L (ref 38–126)
Anion gap: 9 (ref 5–15)
BUN: 16 mg/dL (ref 6–20)
CO2: 22 mmol/L (ref 22–32)
Calcium: 9 mg/dL (ref 8.9–10.3)
Chloride: 106 mmol/L (ref 98–111)
Creatinine, Ser: 0.64 mg/dL (ref 0.44–1.00)
GFR calc Af Amer: >60 mL/min (ref >60)
GFR calc non Af Amer: >60 mL/min (ref >60)
Glucose, Bld: 96 mg/dL (ref 70–99)
Potassium: 4.2 mmol/L (ref 3.5–5.1)
Sodium: 137 mmol/L (ref 135–145)
Total Bilirubin: 0.8 mg/dL (ref 0.3–1.2)
Total Protein: 8.1 g/dL (ref 6.5–8.1)

## 2018-07-28 LAB — I-STAT BETA HCG BLOOD, ED (MC, WL, AP ONLY): I-stat hCG, quantitative: <5 m[IU]/mL (ref <5)

## 2018-07-28 MED ORDER — SODIUM CHLORIDE (PF) 0.9 % IJ SOLN
INTRAMUSCULAR | Status: AC
  Administered 2018-07-28: 10:00:00
  Filled 2018-07-28: qty 50

## 2018-07-28 MED ORDER — IOHEXOL 350 MG/ML SOLN
100.0000 mL | Freq: Once | INTRAVENOUS | Status: AC | PRN
  Administered 2018-07-28: 100 mL via INTRAVENOUS

## 2018-07-28 MED ORDER — METHOCARBAMOL 750 MG PO TABS: 750.0000 mg | ORAL_TABLET | Freq: Three times a day (TID) | ORAL | 0 refills | Status: DC | PRN

## 2018-07-28 NOTE — Discharge Instructions (Signed)
Today your blood work was reassuring.  You are not anemic.  Your CT scan of your head and your neck did not show any evidence of injury to the arteries in your neck.  It did show straightening of the bones in your neck which is something that we often see related to muscle spasms.  As we discussed you may take 2 pills of naproxen every 12 hours.  This is the prescription strength dose, please do not take other NSAIDs such as Motrin, ibuprofen, while taking the Aleve.  You may take Tylenol.  The most Tylenol you can safely take at one time is 1000 mg.  You can take this every 6 hours.  Do not take more than 4000 mg of Tylenol in 1 day.  Please be aware that Tylenol is hidden in many medications and read all labels carefully.  You may get slight stomach upset from taking the Aleve.  If this happens please start taking a heartburn medicine such as Prilosec or another similar medicine.  You are being prescribed a medication which may make you sleepy (muscle relaxer robaxin). For 24 hours after one dose please do not drive, operate heavy machinery, care for a small child with out another adult present, or perform any activities that may cause harm to you or someone else if you were to fall asleep or be impaired.   Please keep your follow-up appointment with sports medicine.    I have given you information about acute torticollis which is the medical term for "a crick in your neck."  I suspect that this is what started your symptoms.  Please make sure you are drinking plenty of water as dehydration (even mild) can worsen muscle cramps.  You should be urinating every few hours and your urine should be clear or light yellow.
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
## 2018-07-28 NOTE — ED Triage Notes (Signed)
Pt arrived stating that she has been having nerve pain in her left arm for over three weeks. Pt states she has seen multiple doctors with no relief. Pt states she has had multiple scans but they are unable to find anything. Pt prescribed pain medication but states they are reacting with her bipolar medication.

## 2018-07-28 NOTE — ED Notes (Signed)
Obtained blood through IV stick, IV would not thread. RN made aware

## 2018-08-01 ENCOUNTER — Encounter: Payer: Self-pay | Admitting: Family Medicine

## 2018-08-01 ENCOUNTER — Ambulatory Visit (INDEPENDENT_AMBULATORY_CARE_PROVIDER_SITE_OTHER): Payer: 59 | Admitting: Family Medicine

## 2018-08-01 ENCOUNTER — Other Ambulatory Visit: Payer: Self-pay

## 2018-08-01 VITALS — BP 120/90 | HR 122 | Ht 63.0 in | Wt 215.0 lb

## 2018-08-01 DIAGNOSIS — F319 Bipolar disorder, unspecified: Secondary | ICD-10-CM

## 2018-08-01 DIAGNOSIS — F322 Major depressive disorder, single episode, severe without psychotic features: Secondary | ICD-10-CM | POA: Diagnosis not present

## 2018-08-01 DIAGNOSIS — M5412 Radiculopathy, cervical region: Secondary | ICD-10-CM | POA: Diagnosis not present

## 2018-08-01 DIAGNOSIS — M542 Cervicalgia: Secondary | ICD-10-CM | POA: Diagnosis not present

## 2018-08-01 MED ORDER — KETOROLAC TROMETHAMINE 60 MG/2ML IM SOLN
60.0000 mg | Freq: Once | INTRAMUSCULAR | Status: AC
  Administered 2018-08-01: 60 mg via INTRAMUSCULAR

## 2018-08-01 NOTE — Progress Notes (Signed)
Tawana ScaleZach Abraham Ellison D.O. [facility] [address] [place], [place] [address] Phone: [phone] Subjective:   I Tiffany NighKana Ellison am serving as a Neurosurgeonscribe for Dr. Antoine PrimasZachary Justen Ellison.    CC: Neck pain follow-up QIO:[id number]:Subjective  Tiffany LackMary Ellison is a 28 y.o. female coming in with complaint of neck pain. States she has shoulder spasms. Recently saw the emergency room. Pain is at an 8. States she is very unhappy with the service currently at [facility]. Patient is very anxious and believes that no one is listening to her. She state she needs a long term solution. Patient is crying. States she lives by herself. Can only sleep about 3 hours. Patient is using profanity to express her pain. Believes taking medications and waiting is not working. Considering PT. patient states that the pain is unrelenting.  Reviewed patient's chart and the emergency room and second visit patient did have CT scans done. CT scans of the neck and head with and without contrast were unremarkable.     Past Medical History:  Diagnosis Date   Anxiety    Asthma    Bipolar 1 disorder (HCC)    Depression    No past surgical history on file. Social History   Socioeconomic History   Marital status: Unknown    Spouse name: Not on file   Number of children: Not on file   Years of education: Not on file   Highest education level: Not on file  Occupational History   Not on file  Social Needs   Financial resource strain: Not on file   Food insecurity    Worry: Not on file    Inability: Not on file   Transportation needs    Medical: Not on file    Non-medical: Not on file  Tobacco Use   Smoking status: Never Smoker   Smokeless tobacco: Never Used  Substance and Sexual Activity   Alcohol use: Yes   Drug use: Never   Sexual activity: Not on file  Lifestyle   Physical activity    Days per week: Not on file    Minutes per session: Not on file   Stress: Not on file  Relationships   Social  connections    Talks on phone: Not on file    Gets together: Not on file    Attends religious service: Not on file    Active member of club or organization: Not on file    Attends meetings of clubs or organizations: Not on file    Relationship status: Not on file  Other Topics Concern   Not on file  Social History Narrative   Not on file   Allergies  Allergen Reactions   Amoxicillin-Pot Clavulanate Diarrhea   Azithromycin Other (See Comments)   Family History  Problem Relation Age of Onset   Diabetes Father    Leukemia Paternal Grandfather     Current Outpatient Medications (Endocrine & Metabolic):    etonogestrel (NEXPLANON) 68 MG IMPL implant, Inject 68 mg into the skin once.     Current Outpatient Medications (Analgesics):    Acetaminophen (TYLENOL 8 HOUR PO), Take 650 mg by mouth every 8 (eight) hours as needed (pain). BID    HYDROcodone-acetaminophen (NORCO/VICODIN) 5-325 MG tablet, Take 1 tablet by mouth every 8 (eight) hours as needed. (Patient taking differently: Take 1 tablet by mouth every 8 (eight) hours as needed for moderate pain. )   nabumetone (RELAFEN) 500 MG tablet, Take 1 tablet (500 mg total) by  mouth 2 (two) times daily.   naproxen (NAPROSYN) 250 MG tablet, Take 250 mg by mouth every 12 (twelve) hours.   Current Outpatient Medications (Other):    cyclobenzaprine (FLEXERIL) 5 MG tablet, Take 1 tablet (5 mg total) by mouth at bedtime as needed for muscle spasms.   gabapentin (NEURONTIN) 100 MG capsule, Take 2 capsules (200 mg total) by mouth at bedtime. (Patient taking differently: Take 100-200 mg by mouth See admin instructions. Take 100mg  every morning, 100mg  at noon and 200mg  at bedtime.)   lidocaine (LIDODERM) 5 %, Place 1 patch onto the skin daily. Remove & Discard patch within 12 hours or as directed by MD   methocarbamol (ROBAXIN) 750 MG tablet, Take 1-2 tablets (750-1,500 mg total) by mouth every 8 (eight) hours as needed for muscle  spasms.    Past medical history, social, surgical and family history all reviewed in electronic medical record.  No pertanent information unless stated regarding to the chief complaint.   Review of Systems:  No  visual changes, nausea, vomiting, diarrhea, constipation,abdominal pain, skin rash, fevers, chills, night sweats, weight loss, swollen lymph nodes,  shortness of breath, mood changes.  Positive muscle aches, joint swelling, body aches, chest pain, headache, dizziness  Objective  Blood pressure 120/90, pulse (!) 122, height 5\' 3"  (1.6 m), weight 215 lb (97.5 kg), last menstrual period 07/14/2018, SpO2 98 %.    General: Patient is extraordinarily standoffish and yelling.  Patient very tearful.  Frustrated at this time.  Tangential thought process HEENT: Pupils equal, extraocular movements intact  Respiratory: Patient's speak in full sentences and does not appear short of breath  Cardiovascular: No lower extremity edema, non tender, no erythema  Skin: Warm dry intact with no signs of infection or rash on extremities or on axial skeleton.  Abdomen: Soft nontender  Neuro: Cranial nerves II through XII are intact, neurovascularly intact in all extremities with 2+ DTRs and 2+ pulses.  Lymph: No lymphadenopathy of posterior or anterior cervical chain or axillae bilaterally.  Gait normal with good balance and coordination.  MSK: Patient has pain to significant light palpation almost anywhere in the body.  Patient states more symptomatic.  Unable to do further exam secondary to the voluntary guarding.    Impression and Recommendations:     This case required medical decision making of moderate complexity. The above documentation has been reviewed and is accurate and complete Lyndal Pulley, DO       Note: This dictation was prepared with Dragon dictation along with smaller phrase technology. Any transcriptional errors that result from this process are unintentional.

## 2018-08-01 NOTE — Patient Instructions (Addendum)
Good to see you  pennsaid pinkie amount topically 2 times daily as needed.  Start duexis tomorrow. 3 times a day for 3 days If increase in pain please go to the emergency room See me again after 6 weeks

## 2018-08-01 NOTE — Assessment & Plan Note (Signed)
Referred to behavioral health 

## 2018-08-01 NOTE — Assessment & Plan Note (Signed)
Patient continues to have pain in the neck but I do believe the underlying major depressive disorder and bipolar is likely contributing to more of the discomfort and pain.  Discussed surgery with her at great length.  Patient given a shot of Toradol because she feels it is healing patient.  Discussed with her I do not feel comfortable giving her any pain medications but does have the muscle relaxers for any breakthrough pain as well as the different anti-inflammatories.  Discussed the possibility of prednisone and patient declined that at the moment.  We discussed the possibility of advanced imaging including MRI of the cervical spine which patient also declined.  Patient wants no willing to be referred to behavioral health which I think will be very beneficial for her.  Action plan given.  Suicidal or homicidal ideation occurs.  Follow-up with me again in 6 weeks.

## 2018-08-02 ENCOUNTER — Encounter: Payer: Self-pay | Admitting: Physical Therapy

## 2018-08-02 ENCOUNTER — Other Ambulatory Visit: Payer: Self-pay

## 2018-08-02 ENCOUNTER — Ambulatory Visit: Payer: 59 | Attending: Family Medicine | Admitting: Physical Therapy

## 2018-08-02 DIAGNOSIS — M5412 Radiculopathy, cervical region: Secondary | ICD-10-CM | POA: Diagnosis present

## 2018-08-02 DIAGNOSIS — M542 Cervicalgia: Secondary | ICD-10-CM

## 2018-08-03 ENCOUNTER — Ambulatory Visit: Payer: Self-pay | Admitting: *Deleted

## 2018-08-03 ENCOUNTER — Telehealth: Payer: Self-pay

## 2018-08-03 ENCOUNTER — Encounter (HOSPITAL_COMMUNITY): Payer: Self-pay | Admitting: Emergency Medicine

## 2018-08-03 ENCOUNTER — Other Ambulatory Visit: Payer: Self-pay

## 2018-08-03 ENCOUNTER — Emergency Department (HOSPITAL_COMMUNITY)
Admission: EM | Admit: 2018-08-03 | Discharge: 2018-08-03 | Disposition: A | Discharge status: Home / Self Care | Payer: 59 | Attending: Emergency Medicine | Admitting: Emergency Medicine

## 2018-08-03 DIAGNOSIS — F319 Bipolar disorder, unspecified: Secondary | ICD-10-CM

## 2018-08-03 DIAGNOSIS — Z79899 Other long term (current) drug therapy: Secondary | ICD-10-CM | POA: Diagnosis not present

## 2018-08-03 DIAGNOSIS — F419 Anxiety disorder, unspecified: Secondary | ICD-10-CM | POA: Diagnosis not present

## 2018-08-03 DIAGNOSIS — J45909 Unspecified asthma, uncomplicated: Secondary | ICD-10-CM | POA: Diagnosis not present

## 2018-08-03 DIAGNOSIS — F322 Major depressive disorder, single episode, severe without psychotic features: Secondary | ICD-10-CM

## 2018-08-03 DIAGNOSIS — M79602 Pain in left arm: Secondary | ICD-10-CM | POA: Diagnosis present

## 2018-08-03 MED ORDER — LORAZEPAM 1 MG PO TABS
1.0000 mg | ORAL_TABLET | Freq: Once | ORAL | Status: AC
  Administered 2018-08-03: 1 mg via ORAL
  Filled 2018-08-03: qty 1

## 2018-08-03 MED ORDER — HYDROXYZINE HCL 25 MG PO TABS: 25.0000 mg | ORAL_TABLET | Freq: Four times a day (QID) | ORAL | 0 refills | Status: DC | PRN

## 2018-08-03 NOTE — ED Notes (Signed)
Per PA, TTS gave patient prescription. Patient and belongings not in room at this time.

## 2018-08-03 NOTE — Telephone Encounter (Signed)
Patient is calling -she is very tearful. She is in a lot of pain. Patient is wanting to be seen by physiatrist. Patient is upset and feels very upset that she can't be seen by a therapist today- she feels she needs medication for her anxiety.Patient states "talk" therapy is not going to be enough for her. Patient is stress about the pandemic and that no one cares about it. Patient is stressed about having to go back to school and she states she needs medication to control her anxiety and no one cares.  Told patient she could be seen today at [facility] by team there to be assessed and Behavioral Health could help her- but she does not want the bill. She states the referrals given by her provider are not good- they do not accept her insurance and are for drug dependence. Patient has called the providers on her insurance list and can not get through to them and she does not want to leave messages about needing appointment- she wants an appointment now. She does not understand that she may not be able to be seen today without being an established patient. Call to office- they are going to check patient's insurance and call here back on 30 minutes.  Reason for Disposition . Requesting to talk to a counselor (e.g., mental health worker, psychiatrist)  Answer Assessment - Initial Assessment Questions 1. CONCERN: "What happened that made you call today?"     Patient feels that she needs to be seen 2. ANXIETY SYMPTOM SCREENING: "Can you describe how you have been feeling?"  (e.g., tense, restless, panicky, anxious, keyed up, trouble sleeping, trouble concentrating)     Pain, anxiety- stress- muscle spasms- pain is in arm/shoulder 3. ONSET: "How long have you been feeling this way?"     1 month 4. RECURRENT: "Have you felt this way before?"  If yes: "What happened that time?" "What helped these feelings go away in the past?"      Panic attack in past- bipolar diagnosis in past 5. RISK OF HARM - SUICIDAL IDEATION:   "Do you ever have thoughts of hurting or killing yourself?"  (e.g., yes, no, no but preoccupation with thoughts about death)   - INTENT:  "Do you have thoughts of hurting or killing yourself right NOW?" (e.g., yes, no, N/A)   - PLAN: "Do you have a specific plan for how you would do this?" (e.g., gun, knife, overdose, no plan, N/A)     No thought of harming herself 6. RISK OF HARM - HOMICIDAL IDEATION:  "Do you ever have thoughts of hurting or killing someone else?"  (e.g., yes, no, no but preoccupation with thoughts about death)   - INTENT:  "Do you have thoughts of hurting or killing someone right NOW?" (e.g., yes, no, N/A)   - PLAN: "Do you have a specific plan for how you would do this?" (e.g., gun, knife, no plan, N/A)      No plan 7. FUNCTIONAL IMPAIRMENT: "How have things been going for you overall in your life? Have you had any more difficulties than usual doing your normal daily activities?"  (e.g., better, same, worse; self-care, school, work, interactions)     no 8. SUPPORT: "Who is with you now?" "Who do you live with?" "Do you have family or friends nearby who you can talk to?"      No- no one to help her 9. THERAPIST: "Do you have a counselor or therapist? Name?"     Therapist at  school- [employer] 10. STRESSORS: "Has there been any new stress or recent changes in your life?"       COVID 11. CAFFEINE ABUSE: "Do you drink caffeinated beverages, and how much each day?" (e.g., coffee, tea, colas)       n/a 12. SUBSTANCE ABUSE: "Do you use any illegal drugs or alcohol?"       no 13. OTHER SYMPTOMS: "Do you have any other physical symptoms right now?" (e.g., chest pain, palpitations, difficulty breathing, fever)       Arm pain/sholder pain 14. PREGNANCY: "Is there any chance you are pregnant?" "When was your last menstrual period?"       N/a- irregular Nexplanon  Protocols used: ANXIETY AND PANIC ATTACK-A-AH

## 2018-08-03 NOTE — Therapy (Signed)
Screening for Suicide  Answer the following questions with Yes or No and place an "x" beside the action taken.  1. Over the past two weeks, have you felt down, depressed, or hopeless?     2. Within the past two weeks, have you felt little interest or pleasure in life?    If YES to either #1 or #2, then ask #3  3. Have you had thoughts that that life is not worth living or that you might be       better off dead?     If answer is NO and suspicion is low, then end   4. Over this past week, have you had any thoughts about hurting or even killing yourself?    If NO, then end. Patient in no immediate danger   5. If so, do you believe that you intend to or will harm yourself?       If NO, then end. Patient in no immediate danger   6.  Do you have a plan as to how you would hurt yourself?     7.  Over this past week, have you actually done anything to hurt yourself?    IF YES answers to either #4, #5, #6 or #7, then patient is AT RISK for suicide   Actions Taken  ____  Screening negative; no further action required  ____  Screening positive; no immediate danger and patient already in treatment with a  mental health provider. Advise patient to speak to their mental health provider.  _x___  Screening positive; no immediate danger. Patient advised to contact a mental  health provider for further assessment.   ____  Screening positive; in immediate danger as patient states intention of killing self,  has plan and a sense of imminence. Do not leave alone. Seek permission from  patient to contact a family member to inform them. Direct patient to go to ED.         [place], [place], [address] Phone: [phone]   Fax:  [phone]  Patient Details  Name: Tiffany Ellison MRN: [id number] Date of Birth: Nov 12, 1990 Referring Provider:  Lyndal Pulley, DO  Encounter Date: 08/02/2018   Beaulah Dinning, PT, DPT 08/03/18 6:56 AM  [facility] [address] [place], [place], [address] Phone: [phone]   Fax:  [phone]

## 2018-08-03 NOTE — Therapy (Signed)
[place], [place], [address] Phone: [phone]   Fax:  [phone]  Physical Therapy Evaluation  Patient Details  Name: Tiffany Ellison MRN: [id number] Date of Birth: Feb 04, 1990 Referring Provider (PT): Hulan Saas, DO   Encounter Date: 08/02/2018  PT End of Session - 08/02/18 2023    Visit Number  1    Number of Visits  8    Date for PT Re-Evaluation  08/30/18    Authorization Type  [employer]    PT Start Time  2703    PT Stop Time  5009    PT Time Calculation (min)  50 min    Activity Tolerance  Patient limited by pain    Behavior During Therapy  Agitated;Restless;Anxious       Past Medical History:  Diagnosis Date  . Anxiety   . Asthma   . Bipolar 1 disorder ([place])   . Depression     History reviewed. No pertinent surgical history.  There were no vitals filed for this visit.   Subjective Assessment - 08/02/18 2000    Subjective  Pt. is a 28 y/o female referred to physical therapy with c/o neck pain and potential radicular symptoms. Of note pt. was tearful throughout much of evaluation with limited positional tolerance for sitting or supine positioning during eval. She reports onset of neck and left proximal UE radiating pain distally to elbow region about a month when she "slept wrong"/onset associated with sleeping position. Pt. has been to ED twice for associated pain issues and reports frustration with continued pain. She has PMH anxiety and depression with bipolar disorder and has been referred to behavioral health in addition to referral yesterday for MRI. Pt. has had X-rays which revealed mild degenerative changes in addition to reversal of lordotic curvature otherwise (-) acute findings.    Pertinent History  anxiety, depression, bipolar    Limitations  Sitting;Lifting;House hold activities;Other (comment)   driving   How long can you sit comfortably?  unable comfortably    How long can you stand  comfortably?  standing is better than sitting but unable comfortably    How long can you walk comfortably?  unable comfortably    Diagnostic tests  X-rays    Patient Stated Goals  relieve pain    Currently in Pain?  Yes    Pain Score  5     Pain Location  Arm    Pain Orientation  Left    Pain Descriptors / Indicators  Constant;Stabbing;Sharp    Pain Type  Acute pain    Pain Radiating Towards  pain from cervical region distally to left elbow region, most predominant pain is in arm    Pain Onset  --   4 weeks ago   Pain Frequency  Constant    Aggravating Factors   sitting, activity    Pain Relieving Factors  heat from hot shower and medication    Effect of Pain on Daily Activities  limits positional tolerance for sitting, tolerance and ability for ADLs/IADLs, driving, tolerance work duties         [facility] PT Assessment - 08/02/18 0001      Assessment   Medical Diagnosis  Neck pain    Referring Provider (PT)  Hulan Saas, DO    Onset Date/Surgical Date  07/05/18    Hand Dominance  Right    Next MD Visit  09/12/2018    Prior Therapy  --   no past formal therapy,  had exercises from Dr. visits     Precautions   Precautions  None      Restrictions   Weight Bearing Restrictions  No      Balance Screen   Has the patient fallen in the past 6 months  No      Home Environment   Living Environment  Private residence    Living Arrangements  Alone      Prior Function   Level of Independence  Independent with basic ADLs      Cognition   Behaviors  Restless;Verbal agitation   frequently tearful, poor positional and activity tolerance     Observation/Other Assessments   Focus on Therapeutic Outcomes (FOTO)   --   not assessed today     Sensation   Light Touch  Appears Intact   C4-T1 dermatomes bilat. WNL     ROM / Strength   AROM / PROM / Strength  AROM;Strength      AROM   Overall AROM Comments  no shoulder ROM limitations noted    AROM Assessment Site  Cervical     Cervical Flexion  25    Cervical Extension  30    Cervical - Right Side Bend  20    Cervical - Left Side Bend  15    Cervical - Right Rotation  43    Cervical - Left Rotation  60      Strength   Overall Strength Comments  Right grip 50 lbs., left grip 20 lbs. with grip dynamonometer    Strength Assessment Site  Shoulder;Elbow;Wrist    Right/Left Shoulder  Right;Left    Right Shoulder Flexion  5/5    Right Shoulder ABduction  5/5    Right Shoulder Internal Rotation  5/5    Right Shoulder External Rotation  5/5    Left Shoulder Flexion  5/5    Left Shoulder ABduction  5/5    Left Shoulder Internal Rotation  5/5    Left Shoulder External Rotation  5/5    Right/Left Elbow  Right;Left    Right Elbow Flexion  5/5    Right Elbow Extension  5/5    Left Elbow Flexion  5/5    Left Elbow Extension  5/5    Right/Left Wrist  Right;Left    Right Wrist Flexion  5/5    Right Wrist Extension  5/5    Left Wrist Flexion  4+/5    Left Wrist Extension  4+/5      Palpation   Palpation comment  tender to palpation with muscle tightness left upper trapezius, levator scapula, cervical paraspinals      Special Tests   Other special tests  ULTT for median, radial, and ulnar nerves (-) on left, Spulrling's not tested due to pain and difficulty with positional tolerance, attempted cervical distraction/trial gentle manual traction but pt. unable to tolerate and requested to stop/change positions                Objective measurements completed on examination: See above findings.      [facility] Adult PT Treatment/Exercise - 08/02/18 0001      Exercises   Exercises  Neck      Neck Exercises: Standing   Other Standing Exercises  HEP instruction cervical retractions, retraction with rotation, left upper trapezius stretch, median nerve glide, scapular retractions with brief handout review      Modalities   Modalities  Moist Heat      Moist Heat Therapy  Number Minutes Moist Heat  5 Minutes    pt. requested to remove after 5 minutes due to "sweating"   Moist Heat Location  Cervical      Manual Therapy   Manual therapy comments  unablre to tolerate trial gentle manual traction due to poor positional tolerance             PT Education - 08/02/18 2020    Education Details  potential symptom etiology for cervical radiculopathy, HEP, POC, deep breathing/suggestion relaxation techniques to decrease sympathetic nervous system impact on pain response, recommendation seek assistance from qualified mental health professional as recommended by Dr. Katrinka BlazingSmith with behavioral health referral to help address anxiety    Person(s) Educated  Patient    Methods  Explanation;Demonstration;Verbal cues    Comprehension  Verbalized understanding;Returned demonstration          PT Long Term Goals - 08/02/18 2041      PT LONG TERM GOAL #1   Title  Increase right cervical AROM at least 10-15 deg to improve ability to turn head while driving    Baseline  43 deg    Time  4    Period  Weeks    Status  New    Target Date  08/30/18      PT LONG TERM GOAL #2   Title  Increase left grip strength at least 10 lbs. and left wrist strength to 5/5 to improve ability for gripping and lifting activities such as carrying grocery bags and performing chores, cleaning    Baseline  left grip 20 lbs., left wrist flex/ext 4+/5    Time  4    Period  Weeks    Status  New    Target Date  08/30/18      PT LONG TERM GOAL #3   Title  Tolerate sitting at least 20 min for local car travel, driving to/from work and appointments with cervical/left UE pain 3/10 or less    Baseline  5/10 with pain up to 8/10    Time  4    Period  Weeks    Status  New    Target Date  08/30/18      PT LONG TERM GOAL #4   Title  Centralize left UE pain to shoulder or more proximally to improve positional tolerance and ability left UE use for gripping and lifting activities    Baseline  distally to elbow    Time  4    Period  Weeks     Status  New    Target Date  08/30/18             Plan - 08/02/18 2024    Clinical Impression Statement  Pt. presents with cervical and left UE radiating symptoms which could be consistent with radiculopathy. Primary objective finding to correlate with subjective report was left wrist and grip weakness. Poor tolerance attempted special tests with poor positional tolerance and associated anxiety and muscle guarding. Significant complicating factors to ability to perform evaluation and for further therapy participation due to anxiety. Performed suicide risk screen and pt. denies suicidal ideation or planning-per screen results and previous plan from Dr. Katrinka BlazingSmith pt. is recommended to follow up with behavioral health. Of note pt. expressed concern about her ability to tolerate driving after PT eval. Offered to contact friend or family member but pt. reported no assistance available. Offered to contact EMS if wishing to go to ED but pt. declined and was able to leave clinic  independently. Pt. expressed view that she did not think therapy was worthwhile and was already performing exercises. Discussed options for other treatment including modalities, traction if able to tolerance and manual therapy as well as potential home options for TENS unit and/or home traction unit-discussed for now would send certification to Dr. Katrinka BlazingSmith for therapy plan of care if wishing to return otherwise recommend follow up as planned for behavioral health referral to help address anxiety issues as well as MRI as ordered for further assessment.    Personal Factors and Comorbidities  Behavior Pattern;Comorbidity 2;Finances    Comorbidities  anxiety and depression/bipolar    Examination-Activity Limitations  Lift;Reach Overhead;Dressing;Sleep;Sit;Carry    Examination-Participation Restrictions  Shop;Cleaning;Driving;Community Activity    Stability/Clinical Decision Making  Evolving/Moderate complexity    Clinical Decision Making   Moderate    Rehab Potential  Fair    PT Frequency  2x / week    PT Duration  4 weeks    PT Treatment/Interventions  ADLs/Self Care Home Management;Cryotherapy;Ultrasound;Traction;Moist Heat;Electrical Stimulation;Therapeutic exercise;Therapeutic activities;Neuromuscular re-education;Patient/family education;Manual techniques;Taping;Dry needling    PT Next Visit Plan  pending ability for positional tolerance potential trial gentle manual vs. mechanical cervical traction, gentle stretches, review HEP and progress retractions to gentle OP vs. extension, US and/or MHP and trial IFC or TENS    PT Home Exercise Plan  cervical retractions, retractions with rotation, left upper trapezius stretch, left median nerve glide, scapular retraction    Consulted and Agree with Plan of Care  Patient       Patient will benefit from skilled therapeutic intervention in order to improve the following deficits and impairments:  Pain, Decreased strength, Decreased activity tolerance, Decreased range of motion  Visit Diagnosis: 1. Cervicalgia   2. Radiculopathy, cervical region        Problem List Patient Active Problem List   Diagnosis Date Noted  . Cervical radiculopathy 07/23/2018  . Tonsillitis 06/16/2011  . Allergic rhinitis 04/16/2010  . Major depressive disorder, single episode, unspecified 04/16/2010  . Other chronic allergic conjunctivitis 04/16/2010  . Other acne 05/27/2008  . Asthma 01/21/2008    Lazarus Gowdahristopher , PT, DPT 08/03/18 6:52 AM  [facility] [address] [place], [place], [address] Phone: [phone]   Fax:  [phone]  Name: Damita LackMary Wist MRN: [id number] Date of Birth: [birth year]

## 2018-08-03 NOTE — Telephone Encounter (Signed)
Copied from [name] [phone]. Topic: General - Other >> Aug 03, 2018  9:22 AM Celene Kras A wrote: Reason for CRM: Pt called and was very hysterical. Pt states she is under a lot of stress and that she is in need of a referral to [facility]. Pt is requesting anxiety medication and for this to be done as soon as possible because she is in a lot of pain. Please advise.

## 2018-08-03 NOTE — Addendum Note (Signed)
Addended by: Ronnald Nian on: 08/03/2018 12:38 PM   Modules accepted: Orders

## 2018-08-03 NOTE — Progress Notes (Addendum)
Patient ID: Tiffany Ellison, female   DOB: 09-15-1990, 28 y.o.   MRN: [id number]  Pt seen by TTS and chart discussed with treatment team. Pt presented to the [facility], voluntarily, seeking help for anxiety and muscle spasms in her arm. Per chart review, Pt had been calling her Dr's office today and became verbally abusive to staff because she could not get into anywhere to see a psychiatrist. She was provided with referrals to [place] and [facility]. She then went to the ED. She is in search of medication for her anxiety. She did receive Ativan 1 mg in the ED. When TTS went to assess her, she refused to be assessed and stated all she needed was medication for anxiety. She does not meet criteria for inpatient admission and she does not want to be inpatient. She denies suicidal/homicidal ideation and denies auditory and visual hallucinations. Recommend a 7 day prescription for Vistaril 25 mg PO three times daily PRN and referral to outpatient resources for follow up. Pt is psychiatrically clear.   Ethelene Hal, PMHNP-BC 08/03/2018       [phone]

## 2018-08-03 NOTE — ED Triage Notes (Addendum)
Patient c/o muscle spasms in left arm x1 month. Reports being seen for same. Hx anxiety and bipolar. Patient tearful and appears anxious. 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

## 2018-08-03 NOTE — ED Notes (Signed)
ED Provider at bedside. 

## 2018-08-03 NOTE — ED Provider Notes (Signed)
[facility]-EMERGENCY DEPT Provider Note   CSN: [id number] Arrival date & time: 08/03/18  1328    History   Chief Complaint Chief Complaint  Patient presents with  . Arm Pain  . Anxiety    HPI Tiffany Ellison is a 28 y.o. female with a PMH of anxiety, asthma, bipolar 1 disorder, and depression presenting with intermittent left arm pain onset 1 month ago. Patient describes pain as spasms and states stress makes symptoms worse. Patient reports she has been stressed due to COVID-19. Patient states she has taken tramadol, Norco, flexeril, steroids, and gabapentin without relief. Patient reports she has taken robaxin, naproxen, and tylenol with partial relief. Patient reports warm compress helps her symptoms the most. Patient reports severe anxiety and states she does not take any medications for anxiety. Patient reports trouble sleeping, agitation, and depression. Patient reports she has been evaluated by multiple providers including sport medicine, who suspect arm pain is likely due to anxiety. Patient reports intermittent paresthesias. Patient denies edema or skin changes. Patient denies numbness or weakness. Patient denies fever, chills, nausea, vomiting, abdominal pain, headache, or injury. Patient denies taking any psychiatric medications and does not have a Veterinary surgeoncounselor. Patient states she has had difficulty scheduling an appointment with psychiatry due to COVID-19. Patient denies SI/HI or hallucinations.     HPI  Past Medical History:  Diagnosis Date  . Anxiety   . Asthma   . Bipolar 1 disorder ([facility])   . Depression     Patient Active Problem List   Diagnosis Date Noted  . Cervical radiculopathy 07/23/2018  . Tonsillitis 06/16/2011  . Allergic rhinitis 04/16/2010  . Major depressive disorder, single episode, unspecified 04/16/2010  . Other chronic allergic conjunctivitis 04/16/2010  . Other acne 05/27/2008  . Asthma 01/21/2008    History reviewed. No pertinent  surgical history.   OB History   No obstetric history on file.      Home Medications    Prior to Admission medications   Medication Sig Start Date End Date Taking? Authorizing Provider  Acetaminophen (TYLENOL 8 HOUR PO) Take 650 mg by mouth every 8 (eight) hours as needed (pain). BID     [provider]  cyclobenzaprine (FLEXERIL) 5 MG tablet Take 1 tablet (5 mg total) by mouth at bedtime as needed for muscle spasms. Patient not taking: Reported on 08/02/2018 07/12/18   Overton Mamirigliano, Meriah K, DO  etonogestrel (NEXPLANON) 68 MG IMPL implant Inject 68 mg into the skin once.  04/17/14   [provider]  gabapentin (NEURONTIN) 100 MG capsule Take 2 capsules (200 mg total) by mouth at bedtime. Patient not taking: Reported on 08/02/2018 07/23/18   Judi SaaSmith, Zachary M, DO  HYDROcodone-acetaminophen (NORCO/VICODIN) 5-325 MG tablet Take 1 tablet by mouth every 8 (eight) hours as needed. Patient not taking: Reported on 08/02/2018 07/24/18   Couture, Cortni S, PA-C  hydrOXYzine (ATARAX/VISTARIL) 25 MG tablet Take 1 tablet (25 mg total) by mouth every 6 (six) hours as needed. 08/03/18   Raeford RazorKohut, Stephen, MD  lidocaine (LIDODERM) 5 % Place 1 patch onto the skin daily. Remove & Discard patch within 12 hours or as directed by MD Patient not taking: Reported on 08/02/2018 07/24/18   Couture, Cortni S, PA-C  methocarbamol (ROBAXIN) 750 MG tablet Take 1-2 tablets (750-1,500 mg total) by mouth every 8 (eight) hours as needed for muscle spasms. 07/28/18   Cristina GongHammond, Elizabeth W, PA-C  nabumetone (RELAFEN) 500 MG tablet Take 1 tablet (500 mg total) by mouth 2 (  two) times daily. 07/12/18   Cirigliano, Jearld LeschMary K, DO  naproxen (NAPROSYN) 250 MG tablet Take 250 mg by mouth every 12 (twelve) hours.    [provider]    Family History Family History  Problem Relation Age of Onset  . Diabetes Father   . Leukemia Paternal Grandfather     Social History Social History   Tobacco Use  . Smoking status: Never  Smoker  . Smokeless tobacco: Never Used  Substance Use Topics  . Alcohol use: Yes  . Drug use: Never     Allergies   Amoxicillin-pot clavulanate and Azithromycin   Review of Systems Review of Systems  Constitutional: Negative for chills, diaphoresis and fever.  Respiratory: Negative for cough and shortness of breath.   Cardiovascular: Negative for chest pain.  Gastrointestinal: Negative for abdominal pain, nausea and vomiting.  Endocrine: Negative for cold intolerance and heat intolerance.  Genitourinary: Negative for dysuria.  Musculoskeletal: Negative for back pain, joint swelling, neck pain and neck stiffness.       Pt reports left arm pain.  Skin: Negative for rash.  Allergic/Immunologic: Negative for immunocompromised state.  Neurological: Negative for weakness and numbness.       Pt reports intermittent paresthesias.   Hematological: Negative for adenopathy.  Psychiatric/Behavioral: Positive for agitation, dysphoric mood and sleep disturbance. Negative for behavioral problems, confusion, decreased concentration, hallucinations, self-injury and suicidal ideas. The patient is nervous/anxious. The patient is not hyperactive.      Physical Exam Updated Vital Signs BP 120/84   Pulse 81   Temp 98.9 F (37.2 C) (Oral)   Resp 18   LMP 07/14/2018   SpO2 100%   Physical Exam Vitals signs and nursing note reviewed.  Constitutional:      General: She is not in acute distress.    Appearance: She is well-developed. She is not diaphoretic.     Comments: Patient is tearful throughout exam due to arm pain and anxiety.   HENT:     Head: Normocephalic and atraumatic.  Neck:     Musculoskeletal: Normal range of motion.  Cardiovascular:     Rate and Rhythm: Normal rate and regular rhythm.     Heart sounds: Normal heart sounds. No murmur. No friction rub. No gallop.   Pulmonary:     Effort: Pulmonary effort is normal. No respiratory distress.     Breath sounds: Normal breath  sounds. No wheezing or rales.  Abdominal:     Palpations: Abdomen is soft.     Tenderness: There is no abdominal tenderness.  Musculoskeletal: Normal range of motion.     Right shoulder: Normal. She exhibits normal range of motion, no tenderness and no bony tenderness.     Left shoulder: Normal. She exhibits normal range of motion, no tenderness, no bony tenderness and no swelling.     Right elbow: Normal.She exhibits normal range of motion, no swelling and no effusion.     Left elbow: Normal. She exhibits normal range of motion, no swelling, no effusion and no deformity.     Right wrist: Normal. She exhibits normal range of motion, no tenderness and no bony tenderness.     Left wrist: Normal. She exhibits normal range of motion, no tenderness, no bony tenderness and no swelling.     Cervical back: Normal. She exhibits normal range of motion, no tenderness, no bony tenderness and no swelling.     Comments: No skin changes noted. No edema or erythema. Full ROM of left arm and neck  without difficulty. No tenderness to palpation. Sensation intact. 2+ radial pulses. 5/5 strength in upper extremities.   Skin:    General: Skin is warm.     Findings: No erythema or rash.  Neurological:     Mental Status: She is alert.  Psychiatric:        Attention and Perception: She is attentive. She does not perceive auditory or visual hallucinations.        Mood and Affect: Mood is anxious. Affect is tearful.        Speech: Speech is rapid and pressured.        Behavior: Behavior is agitated.        Thought Content: Thought content is not paranoid or delusional. Thought content does not include homicidal or suicidal ideation. Thought content does not include homicidal or suicidal plan.        Cognition and Memory: Cognition normal.     ED Treatments / Results  Labs (all labs ordered are listed, but only abnormal results are displayed) Labs Reviewed - No data to display  EKG None  Radiology No  results found.  Procedures Procedures (including critical care time)  Medications Ordered in ED Medications  LORazepam (ATIVAN) tablet 1 mg (1 mg Oral Given 08/03/18 1526)     Initial Impression / Assessment and Plan / ED Course  I have reviewed the triage vital signs and the nursing notes.  Pertinent labs & imaging results that were available during my care of the patient were reviewed by me and considered in my medical decision making (see chart for details).       Patient presents with arm pain and anxiety. Vitals reviewed. Arm patient has had extensive work up in the past and suspect symptoms are likely due to anxiety. Patient has had negative CTA head and neck without acute abnormalities. Patient has not had an injury. Patient has full ROM and is not tender to palpation. Do not suspect additional imaging is required at this time. Provided ativan in the ER due to anxiety. Ativan helped with patient's symptoms significantly. Patient refused lab work at this time. Consulted TTS for further evaluation of anxiety. TTS consult is pending.   Behavior health evaluated the patient and recommends vistaril. Psychiatry resources provided for outpatient follow up.  Vistaril prescribed. Advised patient to follow up with PCP. Patient states she understands and agrees with the plan.   Findings and plan of care discussed with supervising physician Dr. Wilson Singer.   Final Clinical Impressions(s) / ED Diagnoses   Final diagnoses:  Left arm pain  Anxiety    ED Discharge Orders         Ordered    hydrOXYzine (ATARAX/VISTARIL) 25 MG tablet  Every 6 hours PRN     08/03/18 1636           Arville Lime, PA-C 08/03/18 1655    Virgel Manifold, MD 08/04/18 1109

## 2018-08-03 NOTE — Telephone Encounter (Signed)
Spoken with by Dr. Bryan Lemma. Gave further information with new behavior therapist number.

## 2018-08-03 NOTE — Discharge Instructions (Addendum)
You have been seen today for anxiety and arm pain. Please read and follow all provided instructions.   1. Medications: vistaril (for anxiety, this medication may cause drowsiness), usual home medications 2. Treatment: rest, drink plenty of fluids 3. Follow Up: Please follow up with your primary doctor in 2 days for discussion of your diagnoses and further evaluation after today's visit; if you do not have a primary care doctor use the resource guide provided to find one; Please return to the ER for any new or worsening symptoms. Please obtain all of your results from medical records or have your doctors office obtain the results - share them with your doctor - you should be seen at your doctors office. Call today to arrange your follow up.   Take medications as prescribed. Please review all of the medicines and only take them if you do not have an allergy to them. Return to the emergency room for worsening condition or new concerning symptoms. Follow up with your regular doctor. If you don't have a regular doctor use one of the numbers below to establish a primary care doctor.  Please be aware that if you are taking birth control pills, taking other prescriptions, ESPECIALLY ANTIBIOTICS may make the birth control ineffective - if this is the case, either do not engage in sexual activity or use alternative methods of birth control such as condoms until you have finished the medicine and your family doctor says it is OK to restart them. If you are on a blood thinner such as COUMADIN, be aware that any other medicine that you take may cause the coumadin to either work too much, or not enough - you should have your coumadin level rechecked in next 7 days if this is the case.  ?  It is also a possibility that you have an allergic reaction to any of the medicines that you have been prescribed - Everybody reacts differently to medications and while MOST people have no trouble with most medicines, you may have a  reaction such as nausea, vomiting, rash, swelling, shortness of breath. If this is the case, please stop taking the medicine immediately and contact your physician.  ?  You should return to the ER if you develop severe or worsening symptoms.   Emergency Department Resource Guide 1) Find a Doctor and Pay Out of Pocket Although you won't have to find out who is covered by your insurance plan, it is a good idea to ask around and get recommendations. You will then need to call the office and see if the doctor you have chosen will accept you as a new patient and what types of options they offer for patients who are self-pay. Some doctors offer discounts or will set up payment plans for their patients who do not have insurance, but you will need to ask so you aren't surprised when you get to your appointment.  2) Contact Your Local Health Department Not all health departments have doctors that can see patients for sick visits, but many do, so it is worth a call to see if yours does. If you don't know where your local health department is, you can check in your phone book. The CDC also has a tool to help you locate your state's health department, and many state websites also have listings of all of their local health departments.  3) Find a [facility] If your illness is not likely to be very severe or complicated, you may want to try  a walk in clinic. These are popping up all over the country in pharmacies, drugstores, and shopping centers. They're usually staffed by nurse practitioners or physician assistants that have been trained to treat common illnesses and complaints. They're usually fairly quick and inexpensive. However, if you have serious medical issues or chronic medical problems, these are probably not your best option.  No Primary Care Doctor: Call Health Connect at  [phone] - they can help you locate a primary care doctor that  accepts your insurance, provides certain services,  etc. Physician Referral Service- [phone]  Chronic Pain Problems: Organization         Address  Phone   Notes  [facility]  [phone] Patients need to be referred by their primary care doctor.   Medication Assistance: Organization         Address  Phone   Notes  [facility] Medication [facility] [address] [place], [place] [address] [phone] --Must be a resident of [facility] -- Must have NO insurance coverage whatsoever (no Medicaid/ Medicare, etc.) -- The pt. MUST have a primary care doctor that directs their care regularly and follows them in the community   [facility]  [phone]   [employer]  [phone]    Agencies that provide inexpensive medical care: Organization         Address  Phone   Notes  [place]  [phone]   [facility]    [phone]   [facility] [place], [place] [address] [phone]   [place] [address], [place] [phone]   [facility]    [phone]   [facility]    [phone]   [place] [address], [place] Phone:  [phone], Fax:  [phone] Hours of Operation:  9 am - 6 pm, M-F.  Also accepts Medicaid/Medicare and self-pay.  [facility] for [place], [address], [place] Phone: [phone], Fax: [phone]. Hours of Operation:  8:30 am - 5:30 pm, M-F.  Also accepts Medicaid and self-pay.  [facility] [place] [address], [place] Phone: [phone]   [place], [place], [place] [phone], Ext. [phone] Mondays & Thursdays: 7-9 AM.  First 15 patients are seen on a first come, first serve basis.    [place] Providers:  Organization         Address  Phone   Notes  [facility] [address], [place] [phone] Also accepts self-pay patients.  [facility] [address], [place]  [phone]   [place], [address], [place] [phone]   [facility] [address], [place] [phone]   Lucianne Lei [address], [place]   [phone] Only accepts [place] [facility] [place] patients after they have their name applied to their card.   Self-Pay (no insurance) in [facility]:  Organization         Address  Phone   Notes  Sickle Cell Patients, [facility] [place] [phone]   [facility] [place] [phone]   [facility] [place]  [place], [address], [place] [phone]   [facility]/Dr. Osei-Bonsu  [address] [place] or [address], [place] [phone] Phone number for both [place] and [place] locations is the same.  Urgent Medical and [facility] [address], [place] [phone]   [facility] [address] [place] or [address] [phone]   [facility] [address] [place] [phone], phone; [phone], fax Sees patients 1st and 3rd Saturday of every month.  Must not qualify for public or private insurance (i.e. Medicaid, Medicare, [place] Health Choice, Veterans' Benefits)  Household income should be no more than 200% of the poverty level The clinic cannot treat you if you are pregnant or think you are pregnant  Sexually transmitted diseases are not treated at the clinic.

## 2018-08-03 NOTE — Telephone Encounter (Signed)
Call transferred to me after patient became verbally abusive and cursed multiple times at LPN. I spoke with patient and told her that such behavior will not be tolerated towards any member of our staff and is ground for dismissal. Pt stopped cursing and was willing to take the information for [facility] [phone] and [address], [place], [place] [address]) as well as [facility] and their 24/7 crisis hotline ([place] [phone]). She was previously given a referral for [facility] from Dr. Tamala Julian but pt wants psychiatry referral to address need for medication.   Pt then called back to [facility] and states the [facility] does not take her insurance. She is upset she was given info for a place that does not take insurance. I asked [facility] RN to relay to pt that I cannot possibly know which places accept which insurances.   I called pt with information for [facility] and recommend pt contact them. Referral placed.   [facility] [address] [place], [place] [address]  [facility] [address] [place], [place] [address]   PHONE: [phone]

## 2018-08-03 NOTE — ED Notes (Signed)
Patient not visualized in room during discharge attempt.

## 2018-08-03 NOTE — Telephone Encounter (Signed)
Pt given [facility] number and address, and a 24/7 crisis hotline.

## 2018-08-07 ENCOUNTER — Other Ambulatory Visit: Payer: Self-pay

## 2018-08-07 MED ORDER — METHOCARBAMOL 750 MG PO TABS: 750.0000 mg | ORAL_TABLET | Freq: Three times a day (TID) | ORAL | 0 refills | Status: DC | PRN

## 2018-09-12 ENCOUNTER — Ambulatory Visit: Payer: 59 | Admitting: Family Medicine

## 2018-09-12 DIAGNOSIS — Z0289 Encounter for other administrative examinations: Secondary | ICD-10-CM

## 2018-09-12 NOTE — Progress Notes (Deleted)
Tiffany ScaleZach Ellison D.O. [facility] [address] [place], [place] [address] Phone: [phone] Subjective:    I'm seeing this patient by the request  of:    CC:   OZH:YQMVHQIONGHPI:Subjective  Tiffany LackMary Ellison is a 28 y.o. female coming in with complaint of ***  Onset-  Location Duration-  Character- Aggravating factors- Reliving factors-  Therapies tried-  Severity-     Past Medical History:  Diagnosis Date  . Anxiety   . Asthma   . Bipolar 1 disorder (HCC)   . Depression    No past surgical history on file. Social History   Socioeconomic History  . Marital status: Unknown    Spouse name: Not on file  . Number of children: Not on file  . Years of education: Not on file  . Highest education level: Not on file  Occupational History  . Not on file  Social Needs  . Financial resource strain: Not on file  . Food insecurity    Worry: Not on file    Inability: Not on file  . Transportation needs    Medical: Not on file    Non-medical: Not on file  Tobacco Use  . Smoking status: Never Smoker  . Smokeless tobacco: Never Used  Substance and Sexual Activity  . Alcohol use: Yes  . Drug use: Never  . Sexual activity: Not on file  Lifestyle  . Physical activity    Days per week: Not on file    Minutes per session: Not on file  . Stress: Not on file  Relationships  . Social Musicianconnections    Talks on phone: Not on file    Gets together: Not on file    Attends religious service: Not on file    Active member of club or organization: Not on file    Attends meetings of clubs or organizations: Not on file    Relationship status: Not on file  Other Topics Concern  . Not on file  Social History Narrative  . Not on file   Allergies  Allergen Reactions  . Amoxicillin-Pot Clavulanate Diarrhea  . Azithromycin Other (See Comments)   Family History  Problem Relation Age of Onset  . Diabetes Father   . Leukemia Paternal Grandfather     Current Outpatient Medications  (Endocrine & Metabolic):  .  etonogestrel (NEXPLANON) 68 MG IMPL implant, Inject 68 mg into the skin once.     Current Outpatient Medications (Analgesics):  Marland Kitchen.  Acetaminophen (TYLENOL 8 HOUR PO), Take 650 mg by mouth every 8 (eight) hours as needed (pain). BID  .  HYDROcodone-acetaminophen (NORCO/VICODIN) 5-325 MG tablet, Take 1 tablet by mouth every 8 (eight) hours as needed. (Patient not taking: Reported on 08/02/2018) .  nabumetone (RELAFEN) 500 MG tablet, Take 1 tablet (500 mg total) by mouth 2 (two) times daily. .  naproxen (NAPROSYN) 250 MG tablet, Take 250 mg by mouth every 12 (twelve) hours.   Current Outpatient Medications (Other):  .  cyclobenzaprine (FLEXERIL) 5 MG tablet, Take 1 tablet (5 mg total) by mouth at bedtime as needed for muscle spasms. (Patient not taking: Reported on 08/02/2018) .  gabapentin (NEURONTIN) 100 MG capsule, Take 2 capsules (200 mg total) by mouth at bedtime. (Patient not taking: Reported on 08/02/2018) .  hydrOXYzine (ATARAX/VISTARIL) 25 MG tablet, Take 1 tablet (25 mg total) by mouth every 6 (six) hours as needed. .  lidocaine (LIDODERM) 5 %, Place 1 patch onto the skin daily. Remove & Discard patch within  12 hours or as directed by MD (Patient not taking: Reported on 08/02/2018) .  methocarbamol (ROBAXIN) 750 MG tablet, Take 1-2 tablets (750-1,500 mg total) by mouth every 8 (eight) hours as needed for muscle spasms.    Past medical history, social, surgical and family history all reviewed in electronic medical record.  No pertanent information unless stated regarding to the chief complaint.   Review of Systems:  No headache, visual changes, nausea, vomiting, diarrhea, constipation, dizziness, abdominal pain, skin rash, fevers, chills, night sweats, weight loss, swollen lymph nodes, body aches, joint swelling, muscle aches, chest pain, shortness of breath, mood changes.   Objective  There were no vitals taken for this visit. Systems examined below as of     General: No apparent distress alert and oriented x3 mood and affect normal, dressed appropriately.  HEENT: Pupils equal, extraocular movements intact  Respiratory: Patient's speak in full sentences and does not appear short of breath  Cardiovascular: No lower extremity edema, non tender, no erythema  Skin: Warm dry intact with no signs of infection or rash on extremities or on axial skeleton.  Abdomen: Soft nontender  Neuro: Cranial nerves II through XII are intact, neurovascularly intact in all extremities with 2+ DTRs and 2+ pulses.  Lymph: No lymphadenopathy of posterior or anterior cervical chain or axillae bilaterally.  Gait normal with good balance and coordination.  MSK:  Non tender with full range of motion and good stability and symmetric strength and tone of shoulders, elbows, wrist, hip, knee and ankles bilaterally.     Impression and Recommendations:     This case required medical decision making of moderate complexity. The above documentation has been reviewed and is accurate and complete Tiffany Ellison       Note: This dictation was prepared with Dragon dictation along with smaller phrase technology. Any transcriptional errors that result from this process are unintentional.

## 2019-03-25 ENCOUNTER — Ambulatory Visit: Payer: 59 | Attending: Internal Medicine

## 2019-03-25 DIAGNOSIS — Z23 Encounter for immunization: Secondary | ICD-10-CM | POA: Insufficient documentation

## 2019-03-25 NOTE — Progress Notes (Signed)
   Covid-19 Vaccination Clinic  Name:  Aneth Schlagel    MRN: [id number] DOB: 1990-09-24  03/25/2019  Ms. Bradner was observed post Covid-19 immunization for 15 minutes without incident. She was provided with Vaccine Information Sheet and instruction to access the V-Safe system.   Ms. Console was instructed to call 911 with any severe reactions post vaccine: Marland Kitchen Difficulty breathing  . Swelling of face and throat  . A fast heartbeat  . A bad rash all over body  . Dizziness and weakness   Immunizations Administered    Name Date Dose VIS Date Route   Pfizer COVID-19 Vaccine 03/25/2019  6:55 PM 0.3 mL 12/28/2018 Intramuscular   Manufacturer: [employer], [employer]   Lot: [id number]   NDC: [address]-6

## 2019-04-15 ENCOUNTER — Ambulatory Visit: Payer: 59 | Attending: Internal Medicine

## 2019-04-15 DIAGNOSIS — Z23 Encounter for immunization: Secondary | ICD-10-CM

## 2019-04-15 NOTE — Progress Notes (Signed)
   Covid-19 Vaccination Clinic  Name:  Tiffany Ellison    MRN: [id number] DOB: Feb 23, 1990  04/15/2019  [name] was observed post Covid-19 immunization for 15 minutes without incident. She was provided with Vaccine Information Sheet and instruction to access the V-Safe system.   [name] was instructed to call 911 with any severe reactions post vaccine: Marland Kitchen Difficulty breathing  . Swelling of face and throat  . A fast heartbeat  . A bad rash all over body  . Dizziness and weakness   Immunizations Administered    Name Date Dose VIS Date Route   Pfizer COVID-19 Vaccine 04/15/2019  9:54 AM 0.3 mL 12/28/2018 Intramuscular   Manufacturer: [employer], [employer]   Lot: [id number]   NDC: [address]-9

## 2021-06-01 ENCOUNTER — Telehealth: Payer: 59 | Admitting: Physician Assistant

## 2021-06-01 DIAGNOSIS — J040 Acute laryngitis: Secondary | ICD-10-CM | POA: Diagnosis not present

## 2021-06-01 DIAGNOSIS — T7840XA Allergy, unspecified, initial encounter: Secondary | ICD-10-CM

## 2021-06-01 DIAGNOSIS — J029 Acute pharyngitis, unspecified: Secondary | ICD-10-CM

## 2021-06-01 MED ORDER — ALBUTEROL SULFATE HFA 108 (90 BASE) MCG/ACT IN AERS: 2.0000 | INHALATION_SPRAY | Freq: Four times a day (QID) | RESPIRATORY_TRACT | 0 refills | Status: AC | PRN

## 2021-06-01 MED ORDER — BENZONATATE 100 MG PO CAPS: 100.0000 mg | ORAL_CAPSULE | Freq: Three times a day (TID) | ORAL | 0 refills | Status: DC | PRN

## 2021-06-01 MED ORDER — CEFDINIR 300 MG PO CAPS: 300.0000 mg | ORAL_CAPSULE | Freq: Two times a day (BID) | ORAL | 0 refills | Status: DC

## 2021-06-01 MED ORDER — PREDNISONE 20 MG PO TABS: 40.0000 mg | ORAL_TABLET | Freq: Every day | ORAL | 0 refills | Status: DC

## 2021-06-01 NOTE — Patient Instructions (Signed)
?  Tiffany Ellison, thank you for joining Piedad Climes, PA-C for today's virtual visit.  While this provider is not your primary care provider (PCP), if your PCP is located in our provider database this encounter information will be shared with them immediately following your visit. ? ?Consent: ?(Patient) Tiffany Ellison provided verbal consent for this virtual visit at the beginning of the encounter. ? ?Current Medications: ? ?Current Outpatient Medications:  ?  Acetaminophen (TYLENOL 8 HOUR PO), Take 650 mg by mouth every 8 (eight) hours as needed (pain). BID , Disp: , Rfl:  ?  cyclobenzaprine (FLEXERIL) 5 MG tablet, Take 1 tablet (5 mg total) by mouth at bedtime as needed for muscle spasms. (Patient not taking: Reported on 08/02/2018), Disp: 30 tablet, Rfl: 0 ?  etonogestrel (NEXPLANON) 68 MG IMPL implant, Inject 68 mg into the skin once. , Disp: , Rfl:  ?  gabapentin (NEURONTIN) 100 MG capsule, Take 2 capsules (200 mg total) by mouth at bedtime. (Patient not taking: Reported on 08/02/2018), Disp: 180 capsule, Rfl: 3 ?  HYDROcodone-acetaminophen (NORCO/VICODIN) 5-325 MG tablet, Take 1 tablet by mouth every 8 (eight) hours as needed. (Patient not taking: Reported on 08/02/2018), Disp: 4 tablet, Rfl: 0 ?  hydrOXYzine (ATARAX/VISTARIL) 25 MG tablet, Take 1 tablet (25 mg total) by mouth every 6 (six) hours as needed., Disp: 30 tablet, Rfl: 0 ?  lidocaine (LIDODERM) 5 %, Place 1 patch onto the skin daily. Remove & Discard patch within 12 hours or as directed by MD (Patient not taking: Reported on 08/02/2018), Disp: 30 patch, Rfl: 0 ?  methocarbamol (ROBAXIN) 750 MG tablet, Take 1-2 tablets (750-1,500 mg total) by mouth every 8 (eight) hours as needed for muscle spasms., Disp: 60 tablet, Rfl: 0 ?  nabumetone (RELAFEN) 500 MG tablet, Take 1 tablet (500 mg total) by mouth 2 (two) times daily., Disp: 60 tablet, Rfl: 0 ?  naproxen (NAPROSYN) 250 MG tablet, Take 250 mg by mouth every 12 (twelve) hours., Disp: , Rfl:    ? ?Medications ordered in this encounter:  ?No orders of the defined types were placed in this encounter. ?  ? ?*If you need refills on other medications prior to your next appointment, please contact your pharmacy* ? ?Follow-Up: ?Call back or seek an in-person evaluation if the symptoms worsen or if the condition fails to improve as anticipated. ? ?Other Instructions ?Please keep well-hydrated and get plenty of rest.  ?Use the steroid as directed. ?Restart albuterol as needed. ?Take the antibiotics as directed with food.  ?Rest your voice as much as possible. ?Run a humidifier in the bedroom at night.  ? ? ?If you have been instructed to have an in-person evaluation today at a local Urgent Care facility, please use the link below. It will take you to a list of all of our available [facility], including address, phone number and hours of operation. Please do not delay care.  ?[facility] ? ?If you or a family member do not have a primary care provider, use the link below to schedule a visit and establish care. When you choose a [facility] primary care physician or advanced practice provider, you gain a long-term partner in health. ?Find a Primary Care Provider ? ?Learn more about [facility]'s in-office and virtual care options: ?[facility] - Get Care Now  ?

## 2021-06-01 NOTE — Progress Notes (Signed)
?Virtual Visit Consent  ? ?Tiffany Ellison, you are scheduled for a virtual visit with a [facility] provider today. Just as with appointments in the office, your consent must be obtained to participate. Your consent will be active for this visit and any virtual visit you may have with one of our providers in the next 365 days. If you have a MyChart account, a copy of this consent can be sent to you electronically. ? ?As this is a virtual visit, video technology does not allow for your provider to perform a traditional examination. This may limit your provider's ability to fully assess your condition. If your provider identifies any concerns that need to be evaluated in person or the need to arrange testing (such as labs, EKG, etc.), we will make arrangements to do so. Although advances in technology are sophisticated, we cannot ensure that it will always work on either your end or our end. If the connection with a video visit is poor, the visit may have to be switched to a telephone visit. With either a video or telephone visit, we are not always able to ensure that we have a secure connection. ? ?By engaging in this virtual visit, you consent to the provision of healthcare and authorize for your insurance to be billed (if applicable) for the services provided during this visit. Depending on your insurance coverage, you may receive a charge related to this service. ? ?I need to obtain your verbal consent now. Are you willing to proceed with your visit today? Tiffany Ellison has provided verbal consent on 06/01/2021 for a virtual visit (video or telephone). Piedad Climes, PA-C ? ?Date: 06/01/2021 12:01 PM ? ?Virtual Visit via Video Note  ? ?Tiffany Ellison, connected with  Tiffany Ellison  ([id number], May 03, 1990) on 06/01/21 at 11:15 AM EDT by a video-enabled telemedicine application and verified that I am speaking with the correct person using two identifiers. ? ?Location: ?Patient: Virtual Visit Location Patient:  Home ?Provider: Virtual Visit Location Provider: Home Office ?  ?I discussed the limitations of evaluation and management by telemedicine and the availability of in person appointments. The patient expressed understanding and agreed to proceed.   ? ?History of Present Illness: ?Tiffany Ellison is a 31 y.o. who identifies as a female who was assigned female at birth, and is being seen today for significant voice hoarseness and pharyngitis following an allergic reaction to bug spray a few days ago. Notes she tried a new, all natural spray but had a reaction to the aerosol ingredients causing chest tightness, coughing and wheezing. This improved but has been left with some residual tightness and the throat pain/hoarseness. Denies fever, chills.  ? ?HPI: HPI  ?Problems:  ?Patient Active Problem List  ? Diagnosis Date Noted  ? Cervical radiculopathy 07/23/2018  ? Tonsillitis 06/16/2011  ? Allergic rhinitis 04/16/2010  ? Major depressive disorder, single episode, unspecified 04/16/2010  ? Other chronic allergic conjunctivitis 04/16/2010  ? Other acne 05/27/2008  ? Asthma 01/21/2008  ?  ?Allergies:  ?Allergies  ?Allergen Reactions  ? Amoxicillin-Pot Clavulanate Diarrhea  ? Azithromycin Other (See Comments)  ? ?Medications:  ?Current Outpatient Medications:  ?  albuterol (VENTOLIN HFA) 108 (90 Base) MCG/ACT inhaler, Inhale 2 puffs into the lungs every 6 (six) hours as needed for wheezing or shortness of breath., Disp: 8 g, Rfl: 0 ?  benzonatate (TESSALON) 100 MG capsule, Take 1 capsule (100 mg total) by mouth 3 (three) times daily as needed for cough., Disp: 30 capsule,  Rfl: 0 ?  cefdinir (OMNICEF) 300 MG capsule, Take 1 capsule (300 mg total) by mouth 2 (two) times daily., Disp: 14 capsule, Rfl: 0 ?  predniSONE (DELTASONE) 20 MG tablet, Take 2 tablets (40 mg total) by mouth daily with breakfast., Disp: 10 tablet, Rfl: 0 ? ?Observations/Objective: ?Patient is well-developed, well-nourished in no acute distress.  ?Resting  comfortably at home.  ?Head is normocephalic, atraumatic.  ?No labored breathing. ?Speech is coherent with logical content but significant hoarseness noted. ?Patient is alert and oriented at baseline.  ? ?Assessment and Plan: ?1. Allergic reaction, initial encounter ?- predniSONE (DELTASONE) 20 MG tablet; Take 2 tablets (40 mg total) by mouth daily with breakfast.  Dispense: 10 tablet; Refill: 0 ? ?2. Laryngitis ?- predniSONE (DELTASONE) 20 MG tablet; Take 2 tablets (40 mg total) by mouth daily with breakfast.  Dispense: 10 tablet; Refill: 0 ?- benzonatate (TESSALON) 100 MG capsule; Take 1 capsule (100 mg total) by mouth 3 (three) times daily as needed for cough.  Dispense: 30 capsule; Refill: 0 ?- albuterol (VENTOLIN HFA) 108 (90 Base) MCG/ACT inhaler; Inhale 2 puffs into the lungs every 6 (six) hours as needed for wheezing or shortness of breath.  Dispense: 8 g; Refill: 0 ? ?3. Pharyngitis, unspecified etiology ? ?Start Prednisone burst to help further open airways and reduce inflammation in throat/larynx. Supportive measures and OTC medications reviewed. Start Cefdnir due to concern for secondary infection. Strict in-person follow-up precautions reviewed.  ? ?Follow Up Instructions: ?I discussed the assessment and treatment plan with the patient. The patient was provided an opportunity to ask questions and all were answered. The patient agreed with the plan and demonstrated an understanding of the instructions.  A copy of instructions were sent to the patient via MyChart unless otherwise noted below.  ? ?The patient was advised to call back or seek an in-person evaluation if the symptoms worsen or if the condition fails to improve as anticipated. ? ?Time:  ?I spent 10 minutes with the patient via telehealth technology discussing the above problems/concerns.   ? ?Piedad Climes, PA-C ?

## 2021-07-12 ENCOUNTER — Telehealth: Payer: 59 | Admitting: Physician Assistant

## 2021-07-12 DIAGNOSIS — J019 Acute sinusitis, unspecified: Secondary | ICD-10-CM

## 2021-07-12 DIAGNOSIS — B9689 Other specified bacterial agents as the cause of diseases classified elsewhere: Secondary | ICD-10-CM

## 2021-07-12 MED ORDER — DOXYCYCLINE HYCLATE 100 MG PO TABS: 100.0000 mg | ORAL_TABLET | Freq: Two times a day (BID) | ORAL | 0 refills | Status: DC

## 2021-07-12 NOTE — Progress Notes (Signed)
Virtual Visit Consent   Tiffany Ellison, you are scheduled for a virtual visit with a [facility] provider today. Just as with appointments in the office, your consent must be obtained to participate. Your consent will be active for this visit and any virtual visit you may have with one of our providers in the next 365 days. If you have a MyChart account, a copy of this consent can be sent to you electronically.  As this is a virtual visit, video technology does not allow for your provider to perform a traditional examination. This may limit your provider's ability to fully assess your condition. If your provider identifies any concerns that need to be evaluated in person or the need to arrange testing (such as labs, EKG, etc.), we will make arrangements to do so. Although advances in technology are sophisticated, we cannot ensure that it will always work on either your end or our end. If the connection with a video visit is poor, the visit may have to be switched to a telephone visit. With either a video or telephone visit, we are not always able to ensure that we have a secure connection.  By engaging in this virtual visit, you consent to the provision of healthcare and authorize for your insurance to be billed (if applicable) for the services provided during this visit. Depending on your insurance coverage, you may receive a charge related to this service.  I need to obtain your verbal consent now. Are you willing to proceed with your visit today? Tiffany Ellison has provided verbal consent on 07/12/2021 for a virtual visit (video or telephone). Margaretann Loveless, PA-C  Date: 07/12/2021 4:03 PM  Virtual Visit via Video Note   I, Margaretann Loveless, connected with  Tiffany Ellison  ([id number], 33/01/1990) on 07/12/21 at  4:00 PM EDT by a video-enabled telemedicine application and verified that I am speaking with the correct person using two identifiers.  Location: Patient: Virtual Visit Location Patient:  Home Provider: Virtual Visit Location Provider: Home Office   I discussed the limitations of evaluation and management by telemedicine and the availability of in person appointments. The patient expressed understanding and agreed to proceed.    History of Present Illness: Tiffany Ellison is a 31 y.o. who identifies as a female who was assigned female at birth, and is being seen today for possible tonsillitis.  HPI: Sore Throat  This is a recurrent problem. The current episode started in the past 7 days. Associated symptoms include congestion, coughing, ear pain (right), headaches and swollen glands. Pertinent negatives include no ear discharge. Associated symptoms comments: Right side tonsillar swelling, sinus congestion and pain. She has tried acetaminophen and NSAIDs for the symptoms. The treatment provided no relief.      Problems:  Patient Active Problem List   Diagnosis Date Noted   Cervical radiculopathy 07/23/2018   Tonsillitis 06/16/2011   Allergic rhinitis 04/16/2010   Major depressive disorder, single episode, unspecified 04/16/2010   Other chronic allergic conjunctivitis 04/16/2010   Other acne 05/27/2008   Asthma 01/21/2008    Allergies:  Allergies  Allergen Reactions   Amoxicillin-Pot Clavulanate Diarrhea   Azithromycin Other (See Comments)   Medications:  Current Outpatient Medications:    doxycycline (VIBRA-TABS) 100 MG tablet, Take 1 tablet (100 mg total) by mouth 2 (two) times daily., Disp: 20 tablet, Rfl: 0   albuterol (VENTOLIN HFA) 108 (90 Base) MCG/ACT inhaler, Inhale 2 puffs into the lungs every 6 (six) hours as needed for wheezing or  shortness of breath., Disp: 8 g, Rfl: 0   benzonatate (TESSALON) 100 MG capsule, Take 1 capsule (100 mg total) by mouth 3 (three) times daily as needed for cough., Disp: 30 capsule, Rfl: 0   cefdinir (OMNICEF) 300 MG capsule, Take 1 capsule (300 mg total) by mouth 2 (two) times daily., Disp: 14 capsule, Rfl: 0   predniSONE  (DELTASONE) 20 MG tablet, Take 2 tablets (40 mg total) by mouth daily with breakfast., Disp: 10 tablet, Rfl: 0  Observations/Objective: Patient is well-developed, well-nourished in no acute distress.  Resting comfortably at home.  Head is normocephalic, atraumatic.  No labored breathing.  Speech is clear and coherent with logical content.  Patient is alert and oriented at baseline.    Assessment and Plan: 1. Acute bacterial sinusitis - doxycycline (VIBRA-TABS) 100 MG tablet; Take 1 tablet (100 mg total) by mouth 2 (two) times daily.  Dispense: 20 tablet; Refill: 0  - Worsening symptoms that have not responded to OTC medications.  - Will give Doxycycline - Continue allergy medications.  - Steam and humidifier can help - Stay well hydrated and get plenty of rest.  - Seek in person evaluation if no symptom improvement or if symptoms worsen   Follow Up Instructions: I discussed the assessment and treatment plan with the patient. The patient was provided an opportunity to ask questions and all were answered. The patient agreed with the plan and demonstrated an understanding of the instructions.  A copy of instructions were sent to the patient via MyChart unless otherwise noted below.    The patient was advised to call back or seek an in-person evaluation if the symptoms worsen or if the condition fails to improve as anticipated.  Time:  I spent 8 minutes with the patient via telehealth technology discussing the above problems/concerns.    Margaretann Loveless, PA-C

## 2021-09-24 ENCOUNTER — Telehealth: Payer: 59 | Admitting: Family Medicine

## 2021-09-24 DIAGNOSIS — J019 Acute sinusitis, unspecified: Secondary | ICD-10-CM | POA: Diagnosis not present

## 2021-09-24 DIAGNOSIS — B9689 Other specified bacterial agents as the cause of diseases classified elsewhere: Secondary | ICD-10-CM

## 2021-09-24 MED ORDER — CEFDINIR 300 MG PO CAPS: 300.0000 mg | ORAL_CAPSULE | Freq: Two times a day (BID) | ORAL | 0 refills | Status: AC

## 2021-09-24 NOTE — Progress Notes (Signed)
Virtual Visit Consent   Tiffany Ellison, you are scheduled for a virtual visit with a [facility] provider today. Just as with appointments in the office, your consent must be obtained to participate. Your consent will be active for this visit and any virtual visit you may have with one of our providers in the next 365 days. If you have a MyChart account, a copy of this consent can be sent to you electronically.  As this is a virtual visit, video technology does not allow for your provider to perform a traditional examination. This may limit your provider's ability to fully assess your condition. If your provider identifies any concerns that need to be evaluated in person or the need to arrange testing (such as labs, EKG, etc.), we will make arrangements to do so. Although advances in technology are sophisticated, we cannot ensure that it will always work on either your end or our end. If the connection with a video visit is poor, the visit may have to be switched to a telephone visit. With either a video or telephone visit, we are not always able to ensure that we have a secure connection.  By engaging in this virtual visit, you consent to the provision of healthcare and authorize for your insurance to be billed (if applicable) for the services provided during this visit. Depending on your insurance coverage, you may receive a charge related to this service.  I need to obtain your verbal consent now. Are you willing to proceed with your visit today? Tiffany Ellison has provided verbal consent on 09/24/2021 for a virtual visit (video or telephone). Tiffany Ellison, Tiffany Ellison  Date: 09/24/2021 12:02 PM  Virtual Visit via Video Note   I, Tiffany Ellison, connected with  Tiffany Ellison  ([id number], 08/15/1990) on 09/24/21 at 12:00 PM EDT by a video-enabled telemedicine application and verified that I am speaking with the correct person using two identifiers.  Location: Patient: Virtual Visit Location Patient: Home Provider: Virtual  Visit Location Provider: Home Office   I discussed the limitations of evaluation and management by telemedicine and the availability of in person appointments. The patient expressed understanding and agreed to proceed.    History of Present Illness: Tiffany Ellison is a 31 y.o. who identifies as a female who was assigned female at birth, and is being seen today for maxillary sinus pain and pressure, fever, green mucus, headache, neg in home covid testing. Sore throat. All sx for a week with allergies now worsening. Marland Kitchen  HPI: HPI  Problems:  Patient Active Problem List   Diagnosis Date Noted   Cervical radiculopathy 07/23/2018   Tonsillitis 06/16/2011   Allergic rhinitis 04/16/2010   Major depressive disorder, single episode, unspecified 04/16/2010   Other chronic allergic conjunctivitis 04/16/2010   Other acne 05/27/2008   Asthma 01/21/2008    Allergies:  Allergies  Allergen Reactions   Amoxicillin-Pot Clavulanate Diarrhea   Azithromycin Other (See Comments)   Medications:  Current Outpatient Medications:    cefdinir (OMNICEF) 300 MG capsule, Take 1 capsule (300 mg total) by mouth 2 (two) times daily for 10 days., Disp: 20 capsule, Rfl: 0   albuterol (VENTOLIN HFA) 108 (90 Base) MCG/ACT inhaler, Inhale 2 puffs into the lungs every 6 (six) hours as needed for wheezing or shortness of breath., Disp: 8 g, Rfl: 0   benzonatate (TESSALON) 100 MG capsule, Take 1 capsule (100 mg total) by mouth 3 (three) times daily as needed for cough., Disp: 30 capsule, Rfl: 0   cefdinir (  OMNICEF) 300 MG capsule, Take 1 capsule (300 mg total) by mouth 2 (two) times daily., Disp: 14 capsule, Rfl: 0   doxycycline (VIBRA-TABS) 100 MG tablet, Take 1 tablet (100 mg total) by mouth 2 (two) times daily., Disp: 20 tablet, Rfl: 0   predniSONE (DELTASONE) 20 MG tablet, Take 2 tablets (40 mg total) by mouth daily with breakfast., Disp: 10 tablet, Rfl: 0  Observations/Objective: Patient is well-developed, well-nourished  in no acute distress.  Resting comfortably  at home.  Head is normocephalic, atraumatic.  No labored breathing.  Speech is clear and coherent with logical content.  Patient is alert and oriented at baseline.    Assessment and Plan: 1. Acute bacterial sinusitis  Increase fluids, ibuprofen as directed, humidifier at night, continue allergy meds, urgent care if sx persist or worsen.   Follow Up Instructions: I discussed the assessment and treatment plan with the patient. The patient was provided an opportunity to ask questions and all were answered. The patient agreed with the plan and demonstrated an understanding of the instructions.  A copy of instructions were sent to the patient via MyChart unless otherwise noted below.     The patient was advised to call back or seek an in-person evaluation if the symptoms worsen or if the condition fails to improve as anticipated.  Time:  I spent 10 minutes with the patient via telehealth technology discussing the above problems/concerns.    Tiffany Ellison, Tiffany Ellison

## 2021-09-24 NOTE — Patient Instructions (Signed)

## 2021-10-18 ENCOUNTER — Ambulatory Visit
Admission: RE | Admit: 2021-10-18 | Discharge: 2021-10-18 | Disposition: A | Discharge status: Home / Self Care | Payer: 59 | Source: Ambulatory Visit | Attending: Family Medicine | Admitting: Family Medicine

## 2021-10-18 VITALS — BP 124/85 | HR 100 | Temp 97.6°F | Resp 16

## 2021-10-18 DIAGNOSIS — J3089 Other allergic rhinitis: Secondary | ICD-10-CM | POA: Diagnosis not present

## 2021-10-18 DIAGNOSIS — J040 Acute laryngitis: Secondary | ICD-10-CM

## 2021-10-18 DIAGNOSIS — J0100 Acute maxillary sinusitis, unspecified: Secondary | ICD-10-CM | POA: Diagnosis not present

## 2021-10-18 DIAGNOSIS — T7840XA Allergy, unspecified, initial encounter: Secondary | ICD-10-CM

## 2021-10-18 MED ORDER — BENZONATATE 100 MG PO CAPS: 100.0000 mg | ORAL_CAPSULE | Freq: Three times a day (TID) | ORAL | 0 refills | Status: AC | PRN

## 2021-10-18 MED ORDER — CEFDINIR 300 MG PO CAPS: 300.0000 mg | ORAL_CAPSULE | Freq: Two times a day (BID) | ORAL | 0 refills | Status: AC

## 2021-10-18 MED ORDER — PREDNISONE 20 MG PO TABS: 40.0000 mg | ORAL_TABLET | Freq: Every day | ORAL | 0 refills | Status: AC

## 2021-10-18 NOTE — Discharge Instructions (Addendum)
Take cefdinir 300 mg--2 capsules together daily for 7 days  Take prednisone 20 mg--2 daily for 5 days  Take benzonatate 100 mg, 1 tab every 8 hours as needed for cough.

## 2021-10-18 NOTE — ED Provider Notes (Addendum)
[facility]    CSN: [id number] Arrival date & time: 10/18/21  1557      History   Chief Complaint Chief Complaint  Patient presents with   Allergic Reaction    Entered by patient   Cough   Sore Throat    HPI Tiffany Ellison is a 31 y.o. female.    Allergic Reaction Cough Sore Throat   Here for congestion and sinus pressure, sore throat, cough with green mucus, and headache.  Symptoms began a week ago or more.  She has had some low-grade fever through this week.  She has not done a COVID test, and she states "I would be shocked if this were COVID".  No nausea but some vomiting after some hard coughing.  No shortness of breath.  She states she is allergic to a lot of things and she found that she drank from a water fountain with mold at the bottom of it at work right before the symptoms started.  He has had several sinus infections in the last few months.  Past Medical History:  Diagnosis Date   Anxiety    Asthma    Bipolar 1 disorder [facility])    Depression     Patient Active Problem List   Diagnosis Date Noted   Cervical radiculopathy 07/23/2018   Tonsillitis 06/16/2011   Allergic rhinitis 04/16/2010   Major depressive disorder, single episode, unspecified 04/16/2010   Other chronic allergic conjunctivitis 04/16/2010   Other acne 05/27/2008   Asthma 01/21/2008    History reviewed. No pertinent surgical history.  OB History   No obstetric history on file.      Home Medications    Prior to Admission medications   Medication Sig Start Date End Date Taking? Authorizing Provider  albuterol (VENTOLIN HFA) 108 (90 Base) MCG/ACT inhaler Inhale 2 puffs into the lungs every 6 (six) hours as needed for wheezing or shortness of breath. 06/01/21   Waldon Merl, PA-C  benzonatate (TESSALON) 100 MG capsule Take 1 capsule (100 mg total) by mouth 3 (three) times daily as needed for cough. 10/18/21   Zenia Resides, MD  cefdinir (OMNICEF) 300 MG capsule  Take 1 capsule (300 mg total) by mouth 2 (two) times daily for 7 days. 10/18/21 10/25/21  Zenia Resides, MD  predniSONE (DELTASONE) 20 MG tablet Take 2 tablets (40 mg total) by mouth daily with breakfast for 5 days. 10/18/21 10/23/21  Zenia Resides, MD    Family History Family History  Problem Relation Age of Onset   Diabetes Father    Leukemia Paternal Grandfather     Social History Social History   Tobacco Use   Smoking status: Never   Smokeless tobacco: Never  Vaping Use   Vaping Use: Never used  Substance Use Topics   Alcohol use: Yes   Drug use: Never     Allergies   Amoxicillin-pot clavulanate and Azithromycin   Review of Systems Review of Systems  Respiratory:  Positive for cough.      Physical Exam Triage Vital Signs ED Triage Vitals  Enc Vitals Group     BP 10/18/21 1657 124/85     Pulse Rate 10/18/21 1657 100     Resp 10/18/21 1657 16     Temp 10/18/21 1657 97.6 F (36.4 C)     Temp Source 10/18/21 1657 Oral     SpO2 10/18/21 1657 98 %     Weight --      Height --  Head Circumference --      Peak Flow --      Pain Score 10/18/21 1704 5     Pain Loc --      Pain Edu? --      Excl. in GC? --    No data found.  Updated Vital Signs BP 124/85 (BP Location: Right Arm)   Pulse 100   Temp 97.6 F (36.4 C) (Oral)   Resp 16   SpO2 98%   Visual Acuity Right Eye Distance:   Left Eye Distance:   Bilateral Distance:    Right Eye Near:   Left Eye Near:    Bilateral Near:     Physical Exam Vitals reviewed.  Constitutional:      General: She is not in acute distress.    Appearance: She is not toxic-appearing.  HENT:     Right Ear: Tympanic membrane and ear canal normal.     Left Ear: Tympanic membrane and ear canal normal.     Nose: Nose normal.     Mouth/Throat:     Mouth: Mucous membranes are moist.     Comments: There is some erythema and hypertrophy of both tonsils.  Clear mucus is in the tonsillar crypts.  There is no  asymmetry Eyes:     Extraocular Movements: Extraocular movements intact.     Conjunctiva/sclera: Conjunctivae normal.     Pupils: Pupils are equal, round, and reactive to light.  Cardiovascular:     Rate and Rhythm: Normal rate and regular rhythm.     Heart sounds: No murmur heard. Pulmonary:     Effort: Pulmonary effort is normal. No respiratory distress.     Breath sounds: No stridor. No wheezing, rhonchi or rales.  Musculoskeletal:     Cervical back: Neck supple.  Lymphadenopathy:     Cervical: No cervical adenopathy.  Skin:    Capillary Refill: Capillary refill takes less than 2 seconds.     Coloration: Skin is not jaundiced or pale.  Neurological:     General: No focal deficit present.     Mental Status: She is alert and oriented to person, place, and time.  Psychiatric:        Behavior: Behavior normal.      UC Treatments / Results  Labs (all labs ordered are listed, but only abnormal results are displayed) Labs Reviewed - No data to display  EKG   Radiology No results found.  Procedures Procedures (including critical care time)  Medications Ordered in UC Medications - No data to display  Initial Impression / Assessment and Plan / UC Course  I have reviewed the triage vital signs and the nursing notes.  Pertinent labs & imaging results that were available during my care of the patient were reviewed by me and considered in my medical decision making (see chart for details).        I am going to treat for possible acute sinusitis and also for allergic reaction with antibiotics and prednisone.  I discussed with her possibly she could benefit from seeing an ENT or an allergist; she states "that sounds like something I do not have time for and cannot afford". I also suggested possible saline nasal rinses with saline nasal spray; she states that absolutely would be awful and she is scared of drowning  I suggested possibly some Mucinex D for her symptoms  also.  I do think it would have been possible for her to have had COVID when this initially started.  I will not do testing today, as she is outside the window for antivirals to be of benefit for her  Final Clinical Impressions(s) / UC Diagnoses   Final diagnoses:  Acute maxillary sinusitis, recurrence not specified  Non-seasonal allergic rhinitis due to fungal spores     Discharge Instructions      Take cefdinir 300 mg--2 capsules together daily for 7 days  Take prednisone 20 mg--2 daily for 5 days  Take benzonatate 100 mg, 1 tab every 8 hours as needed for cough.       ED Prescriptions     Medication Sig Dispense Auth. Provider   predniSONE (DELTASONE) 20 MG tablet Take 2 tablets (40 mg total) by mouth daily with breakfast for 5 days. 10 tablet Barrett Henle, MD   benzonatate (TESSALON) 100 MG capsule Take 1 capsule (100 mg total) by mouth 3 (three) times daily as needed for cough. 30 capsule Barrett Henle, MD   cefdinir (OMNICEF) 300 MG capsule Take 1 capsule (300 mg total) by mouth 2 (two) times daily for 7 days. 14 capsule Windy Carina, Gwenlyn Perking, MD      PDMP not reviewed this encounter.   Barrett Henle, MD 10/18/21 1741    Barrett Henle, MD 10/18/21 [phone]

## 2021-10-18 NOTE — ED Triage Notes (Signed)
Pt states she had been drinking from a water fountain that she found out had mold at the bse. Pt c/o sore throat, sinus issues, and productive cough.  Started: about a week ago  Home interventions: motrin

## 2022-05-02 ENCOUNTER — Encounter: Payer: Self-pay | Admitting: *Deleted
